# Patient Record
Sex: Female | Born: 1975 | Race: Black or African American | Hispanic: No | Marital: Single | Smoking: Current every day smoker
Health system: Southern US, Community
[De-identification: ages and names within clinical notes are randomized; demographics above are authoritative.]

---

## 2013-05-12 ENCOUNTER — Emergency Department: Payer: Self-pay | Admitting: Emergency Medicine

## 2018-07-08 ENCOUNTER — Encounter: Payer: Self-pay | Admitting: Emergency Medicine

## 2018-07-08 ENCOUNTER — Emergency Department: Payer: Self-pay

## 2018-07-08 ENCOUNTER — Other Ambulatory Visit: Payer: Self-pay

## 2018-07-08 ENCOUNTER — Emergency Department
Admission: EM | Admit: 2018-07-08 | Discharge: 2018-07-08 | Disposition: A | Discharge status: Home / Self Care | Payer: Self-pay | Attending: Emergency Medicine | Admitting: Emergency Medicine

## 2018-07-08 DIAGNOSIS — B9689 Other specified bacterial agents as the cause of diseases classified elsewhere: Secondary | ICD-10-CM | POA: Insufficient documentation

## 2018-07-08 DIAGNOSIS — F17210 Nicotine dependence, cigarettes, uncomplicated: Secondary | ICD-10-CM | POA: Insufficient documentation

## 2018-07-08 DIAGNOSIS — N760 Acute vaginitis: Secondary | ICD-10-CM | POA: Insufficient documentation

## 2018-07-08 DIAGNOSIS — N289 Disorder of kidney and ureter, unspecified: Secondary | ICD-10-CM | POA: Insufficient documentation

## 2018-07-08 LAB — COMPREHENSIVE METABOLIC PANEL
ALT: 13 U/L (ref 0–44)
AST: 15 U/L (ref 15–41)
Albumin: 3.8 g/dL (ref 3.5–5.0)
Alkaline Phosphatase: 48 U/L (ref 38–126)
Anion gap: 6 (ref 5–15)
BUN: 16 mg/dL (ref 6–20)
CO2: 23 mmol/L (ref 22–32)
Calcium: 8.7 mg/dL — ABNORMAL LOW (ref 8.9–10.3)
Chloride: 104 mmol/L (ref 98–111)
Creatinine, Ser: 0.76 mg/dL (ref 0.44–1.00)
GFR calc Af Amer: >60 mL/min (ref >60)
GFR calc non Af Amer: >60 mL/min (ref >60)
Glucose, Bld: 95 mg/dL (ref 70–99)
Potassium: 4 mmol/L (ref 3.5–5.1)
Sodium: 133 mmol/L — ABNORMAL LOW (ref 135–145)
Total Bilirubin: 0.5 mg/dL (ref 0.3–1.2)
Total Protein: 8 g/dL (ref 6.5–8.1)

## 2018-07-08 LAB — URINALYSIS, COMPLETE (UACMP) WITH MICROSCOPIC
Bacteria, UA: NONE SEEN
Bilirubin Urine: NEGATIVE
Glucose, UA: NEGATIVE mg/dL
Ketones, ur: NEGATIVE mg/dL
Leukocytes,Ua: NEGATIVE
Nitrite: NEGATIVE
Protein, ur: NEGATIVE mg/dL
Specific Gravity, Urine: 1.02 (ref 1.005–1.030)
pH: 5 (ref 5.0–8.0)

## 2018-07-08 LAB — WET PREP, GENITAL
Sperm: NONE SEEN
Trich, Wet Prep: NONE SEEN
Yeast Wet Prep HPF POC: NONE SEEN

## 2018-07-08 LAB — CBC
HCT: 39.9 % (ref 36.0–46.0)
Hemoglobin: 13 g/dL (ref 12.0–15.0)
MCH: 26.2 pg (ref 26.0–34.0)
MCHC: 32.6 g/dL (ref 30.0–36.0)
MCV: 80.4 fL (ref 80.0–100.0)
Platelets: 427 10*3/uL — ABNORMAL HIGH (ref 150–400)
RBC: 4.96 MIL/uL (ref 3.87–5.11)
RDW: 15.1 % (ref 11.5–15.5)
WBC: 12.2 10*3/uL — ABNORMAL HIGH (ref 4.0–10.5)
nRBC: 0 % (ref 0.0–0.2)

## 2018-07-08 LAB — POCT PREGNANCY, URINE: Preg Test, Ur: NEGATIVE

## 2018-07-08 LAB — LIPASE, BLOOD: Lipase: 30 U/L (ref 11–51)

## 2018-07-08 LAB — CHLAMYDIA/NGC RT PCR (ARMC ONLY)
Chlamydia Tr: NOT DETECTED
N gonorrhoeae: NOT DETECTED

## 2018-07-08 LAB — CHLAMYDIA/NGC RT PCR (ARMC ONLY)??????????

## 2018-07-08 MED ORDER — IOHEXOL 240 MG/ML SOLN
50.0000 mL | Freq: Once | INTRAMUSCULAR | Status: AC
  Administered 2018-07-08: 09:00:00 50 mL via ORAL

## 2018-07-08 MED ORDER — ONDANSETRON HCL 4 MG/2ML IJ SOLN
4.0000 mg | Freq: Once | INTRAMUSCULAR | Status: AC
  Administered 2018-07-08: 4 mg via INTRAVENOUS
  Filled 2018-07-08: qty 2

## 2018-07-08 MED ORDER — SODIUM CHLORIDE 0.9 % IV BOLUS
1000.0000 mL | Freq: Once | INTRAVENOUS | Status: AC
  Administered 2018-07-08: 1000 mL via INTRAVENOUS

## 2018-07-08 MED ORDER — METRONIDAZOLE 500 MG PO TABS: 500.0000 mg | ORAL_TABLET | Freq: Three times a day (TID) | ORAL | 0 refills | Status: DC

## 2018-07-08 MED ORDER — IOHEXOL 300 MG/ML  SOLN
100.0000 mL | Freq: Once | INTRAMUSCULAR | Status: AC | PRN
  Administered 2018-07-08: 100 mL via INTRAVENOUS

## 2018-07-08 MED ORDER — MORPHINE SULFATE (PF) 4 MG/ML IV SOLN
4.0000 mg | Freq: Once | INTRAVENOUS | Status: AC
  Administered 2018-07-08: 09:00:00 4 mg via INTRAVENOUS
  Filled 2018-07-08: qty 1

## 2018-07-08 MED ORDER — METRONIDAZOLE 500 MG PO TABS
500.0000 mg | ORAL_TABLET | Freq: Once | ORAL | Status: AC
  Administered 2018-07-08: 500 mg via ORAL
  Filled 2018-07-08: qty 1

## 2018-07-08 NOTE — ED Triage Notes (Signed)
Pt presents to ED via POV with c/o lower abdominal pain x 2 days with worsening last night. Denies N/V/D. Denies urinary symptoms but states feels like UTI in the past.

## 2018-07-08 NOTE — Discharge Instructions (Addendum)
OF NOTE: We see an abnormal area in the left kidney. Abdominal MRI with and without IV gadolinium is strongly recommended to evaluate further per our radiology doctor. Please see your primary doctor or UROLOGY for a follow-up on this concern and to obtain further testing to make cure this is not a cancer.  Please return to the emergency room right away if you are to develop a fever, severe nausea, your pain becomes severe or worsens, you are unable to keep food down, begin vomiting any dark or bloody fluid, you develop any dark or bloody stools, feel dehydrated, or other new concerns or symptoms arise.

## 2018-07-08 NOTE — ED Provider Notes (Signed)
[facility] Emergency Department Provider Note   ____________________________________________   First MD Initiated Contact with Patient 07/08/18 [phone]     (approximate)  I have reviewed the triage vital signs and the nursing notes.   HISTORY  Chief Complaint Abdominal Pain    HPI Adriana Harrison is a 43 y.o. female here for evaluation for lower and left lower abdominal pain  Patient reports about 2 days ago started having pain in her lower abdomen.  She reports it feels like is an area of her bladder but she has any pain or burning with urination which she is experience with urinary tract infection in the past  She denies pregnancy.  She is not noticed any unusual vaginal discharge except she had a spit of an irregular cycle last month which is unusual for her.  Pain does not radiate to the back.  Is located mostly in the area of her bladder and left lower abdomen.  No nausea vomiting or diarrhea.  No fevers or chills.  No travel history or sick contacts that she is aware of no chest pain or trouble breathing   History reviewed. No pertinent past medical history.  There are no active problems to display for this patient.   History reviewed. No pertinent surgical history.  Prior to Admission medications   Medication Sig Start Date End Date Taking? Authorizing Provider  metroNIDAZOLE (FLAGYL) 500 MG tablet Take 1 tablet (500 mg total) by mouth 3 (three) times daily. 07/08/18   Delman Kitten, MD    Allergies Patient has no known allergies.  No family history on file.  Social History Social History   Tobacco Use   Smoking status: Current Every Day Smoker   Smokeless tobacco: Never Used  Substance Use Topics   Alcohol use: Yes   Drug use: Yes    Types: Marijuana    Review of Systems Constitutional: No fever/chills Eyes: No visual changes. ENT: No sore throat. Cardiovascular: Denies chest pain. Respiratory: Denies shortness of  breath. Gastrointestinal: Moderate to severe pain in her mid to lower left abdomen. Genitourinary: Negative for dysuria. Musculoskeletal: Negative for back pain. Skin: Negative for rash. Neurological: Negative for headaches, areas of focal weakness or numbness.    ____________________________________________   PHYSICAL EXAM:  VITAL SIGNS: ED Triage Vitals [07/08/18 0736]  Enc Vitals Group     BP (!) 127/95     Pulse Rate 81     Resp 20     Temp 98 F (36.7 C)     Temp Source Oral     SpO2 98 %     Weight 180 lb (81.6 kg)     Height 5\' 4"  (1.626 m)     Head Circumference      Peak Flow      Pain Score 9     Pain Loc      Pain Edu?      Excl. in [place]?     Constitutional: Alert and oriented. Well appearing and in no acute distress. Eyes: Conjunctivae are normal. Head: Atraumatic. Nose: No congestion/rhinnorhea. Mouth/Throat: Mucous membranes are moist. Neck: No stridor.  Cardiovascular: Normal rate, regular rhythm. Grossly normal heart sounds.  Good peripheral circulation. Respiratory: Normal respiratory effort.  No retractions. Lungs CTAB. Gastrointestinal: Soft and nontender. No distention. Gynecologic: Performed with tech Kayla, external exam is normal.  Internal exam nontender.  No cervical motion tenderness.  Just a scant amount of slight whitish discharge is present, sent for wet prep and GC.  No frank adnexal tenderness.  Patient reports no tenderness to digital exam. Musculoskeletal: No lower extremity tenderness nor edema. Neurologic:  Normal speech and language. No gross focal neurologic deficits are appreciated.  Skin:  Skin is warm, dry and intact. No rash noted. Psychiatric: Mood and affect are normal. Speech and behavior are normal.  ____________________________________________   LABS (all labs ordered are listed, but only abnormal results are displayed)  Labs Reviewed  WET PREP, GENITAL - Abnormal; Notable for the following components:      Result  Value   Clue Cells Wet Prep HPF POC PRESENT (*)    WBC, Wet Prep HPF POC RARE (*)    All other components within normal limits  URINALYSIS, COMPLETE (UACMP) WITH MICROSCOPIC - Abnormal; Notable for the following components:   Color, Urine YELLOW (*)    APPearance HAZY (*)    Hgb urine dipstick SMALL (*)    All other components within normal limits  CBC - Abnormal; Notable for the following components:   WBC 12.2 (*)    Platelets 427 (*)    All other components within normal limits  COMPREHENSIVE METABOLIC PANEL - Abnormal; Notable for the following components:   Sodium 133 (*)    Calcium 8.7 (*)    All other components within normal limits  CHLAMYDIA/NGC RT PCR ([facility] ONLY)  LIPASE, BLOOD  POCT PREGNANCY, URINE   ____________________________________________  EKG   ____________________________________________  RADIOLOGY  US Transvaginal Non-ob  Result Date: 07/08/2018 CLINICAL DATA:  Left lower quadrant pain.  Suprapubic pain. EXAM: TRANSABDOMINAL AND TRANSVAGINAL ULTRASOUND OF PELVIS DOPPLER ULTRASOUND OF OVARIES TECHNIQUE: Both transabdominal and transvaginal ultrasound examinations of the pelvis were performed. Transabdominal technique was performed for global imaging of the pelvis including uterus, ovaries, adnexal regions, and pelvic cul-de-sac. It was necessary to proceed with endovaginal exam following the transabdominal exam to visualize the endometrium and ovaries. Color and duplex Doppler ultrasound was utilized to evaluate blood flow to the ovaries. COMPARISON:  None. FINDINGS: Uterus Measurements: 13.1 x 7.2 x 7.9 cm = volume: 394 mL. There is a 5.6 cm fibroid in the uterus. Endometrium Thickness: 5.1 mm.  No focal abnormality visualized. Right ovary The right ovary was not visualized. Left ovary Measurements: 2.8 x 2.5 x 2.6 cm = volume: 9.8 mL. Normal appearance/no adnexal mass. Pulsed Doppler evaluation of the left ovary demonstrates normal low-resistance  arterial and venous waveforms. Other findings No abnormal free fluid. IMPRESSION: 1. There is a 5.6 cm fibroid in the uterus. The uterus and endometrium are otherwise normal. 2. The left ovary is normal in appearance with arterial and venous blood flow identified. 3. The right ovary was not visualized. Electronically Signed   By: Dorise Bullion III M.D   On: 07/08/2018 10:37   US Pelvis Complete  Result Date: 07/08/2018 CLINICAL DATA:  Left lower quadrant pain.  Suprapubic pain. EXAM: TRANSABDOMINAL AND TRANSVAGINAL ULTRASOUND OF PELVIS DOPPLER ULTRASOUND OF OVARIES TECHNIQUE: Both transabdominal and transvaginal ultrasound examinations of the pelvis were performed. Transabdominal technique was performed for global imaging of the pelvis including uterus, ovaries, adnexal regions, and pelvic cul-de-sac. It was necessary to proceed with endovaginal exam following the transabdominal exam to visualize the endometrium and ovaries. Color and duplex Doppler ultrasound was utilized to evaluate blood flow to the ovaries. COMPARISON:  None. FINDINGS: Uterus Measurements: 13.1 x 7.2 x 7.9 cm = volume: 394 mL. There is a 5.6 cm fibroid in the uterus. Endometrium Thickness: 5.1 mm.  No focal abnormality visualized. Right  ovary The right ovary was not visualized. Left ovary Measurements: 2.8 x 2.5 x 2.6 cm = volume: 9.8 mL. Normal appearance/no adnexal mass. Pulsed Doppler evaluation of the left ovary demonstrates normal low-resistance arterial and venous waveforms. Other findings No abnormal free fluid. IMPRESSION: 1. There is a 5.6 cm fibroid in the uterus. The uterus and endometrium are otherwise normal. 2. The left ovary is normal in appearance with arterial and venous blood flow identified. 3. The right ovary was not visualized. Electronically Signed   By: Dorise Bullion III M.D   On: 07/08/2018 10:37   Ct Abdomen Pelvis W Contrast  Result Date: 07/08/2018 CLINICAL DATA:  43 year old female with history of  abdominal and pelvic pain for the past 4 days. EXAM: CT ABDOMEN AND PELVIS WITH CONTRAST TECHNIQUE: Multidetector CT imaging of the abdomen and pelvis was performed using the standard protocol following bolus administration of intravenous contrast. CONTRAST:  153mL OMNIPAQUE IOHEXOL 300 MG/ML  SOLN COMPARISON:  None. FINDINGS: Lower chest: Unremarkable. Hepatobiliary: Subcentimeter low-attenuation lesion in the central aspect of segment 8 of the liver, too small to characterize, but statistically likely a cyst. In segment 6 (axial image 25 of series 2) there is a centrally low-attenuation lesion measuring 2.1 x 2.5 cm with some peripheral nodular enhancement with progressive centripetal filling, diagnostic of a cavernous hemangioma. No other suspicious cystic or solid hepatic lesions. No intra or extrahepatic biliary ductal dilatation. Gallbladder is normal in appearance. Pancreas: No pancreatic mass. No pancreatic ductal dilatation. No pancreatic or peripancreatic fluid or inflammatory changes. Spleen: Unremarkable. Adrenals/Urinary Tract: In the lateral aspect of the interpolar region of the left kidney there is a 3.1 x 2.9 x 3.3 cm lesion which demonstrates heterogeneous internal enhancement/attenuation, and has some peripheral calcifications, considered indeterminate. This is separate from the left renal vein which is widely patent, and appears encapsulated within Gerota's fascia. Right kidney and bilateral adrenal glands are normal in appearance. No hydroureteronephrosis. Urinary bladder is normal in appearance. Stomach/Bowel: Normal appearance of the stomach. No pathologic dilatation of small bowel or colon. Normal appendix. Vascular/Lymphatic: No atherosclerotic calcifications in the abdominal aorta or pelvic vasculature. No lymphadenopathy noted in the abdomen or pelvis. Reproductive: In the fundus of the uterus there is a 4.8 x 5.8 x 5.3 cm centrally hypovascular lesion, likely to represent a large fibroid.  Several other smaller uterine lesions are also noted. Ovaries are unremarkable in appearance. Other: No significant volume of ascites.  No pneumoperitoneum. Musculoskeletal: There are no aggressive appearing lytic or blastic lesions noted in the visualized portions of the skeleton. IMPRESSION: 1. No acute findings are noted in the abdomen or pelvis to account for the patient's symptoms. 2. Indeterminate lesion in the interpolar region of the left kidney. Further evaluation with nonemergent abdominal MRI with and without IV gadolinium is strongly recommended to evaluate for potential neoplasm. 3. Fibroid uterus. 4. 2.1 x 2.5 cm cavernous hemangioma in segment 6 of the liver incidentally noted. Electronically Signed   By: Vinnie Langton M.D.   On: 07/08/2018 11:16   [place] Art/ven Flow Abd Pelv Doppler  Result Date: 07/08/2018 CLINICAL DATA:  Left lower quadrant pain.  Suprapubic pain. EXAM: TRANSABDOMINAL AND TRANSVAGINAL ULTRASOUND OF PELVIS DOPPLER ULTRASOUND OF OVARIES TECHNIQUE: Both transabdominal and transvaginal ultrasound examinations of the pelvis were performed. Transabdominal technique was performed for global imaging of the pelvis including uterus, ovaries, adnexal regions, and pelvic cul-de-sac. It was necessary to proceed with endovaginal exam following the transabdominal exam to visualize the endometrium and  ovaries. Color and duplex Doppler ultrasound was utilized to evaluate blood flow to the ovaries. COMPARISON:  None. FINDINGS: Uterus Measurements: 13.1 x 7.2 x 7.9 cm = volume: 394 mL. There is a 5.6 cm fibroid in the uterus. Endometrium Thickness: 5.1 mm.  No focal abnormality visualized. Right ovary The right ovary was not visualized. Left ovary Measurements: 2.8 x 2.5 x 2.6 cm = volume: 9.8 mL. Normal appearance/no adnexal mass. Pulsed Doppler evaluation of the left ovary demonstrates normal low-resistance arterial and venous waveforms. Other findings No abnormal free fluid. IMPRESSION: 1.  There is a 5.6 cm fibroid in the uterus. The uterus and endometrium are otherwise normal. 2. The left ovary is normal in appearance with arterial and venous blood flow identified. 3. The right ovary was not visualized. Electronically Signed   By: Dorise Bullion III M.D   On: 07/08/2018 10:37   Ultrasound reviewed, negative for acute ovarian process.  Pain involves the left side, the right ovary is not seen but this is also not the area of pain.  I discussed the CT finding especially of the concerned about her left kidney and need for follow-up, she will follow-up with urology to obtain further evaluation and MRI for this. ____________________________________________   PROCEDURES  Procedure(s) performed: None  Procedures  Critical Care performed: No  ____________________________________________   INITIAL IMPRESSION / ASSESSMENT AND PLAN / ED COURSE  Pertinent labs & imaging results that were available during my care of the patient were reviewed by me and considered in my medical decision making (see chart for details).   Differential diagnosis includes but is not limited to, abdominal perforation, aortic dissection, cholecystitis, appendicitis, diverticulitis, colitis, esophagitis/gastritis, kidney stone, pyelonephritis, urinary tract infection, aortic aneurysm. All are considered in decision and treatment plan. Based upon the patient's presentation and risk factors, we obtained a pelvic exam that did not show adnexal tenderness, her pregnancy test is negative.  Her urinalysis does not show any sign of infection thus prompting me to look further for cause including rule out causes such as ovarian cyst, cyst rupture, torsion though I find this unlikely with 2 days and the type of her pain she is experiencing, also diverticulitis, retrocecal appendicitis etc. are all considered.  And we will proceed with CT scan as well as ultrasound at this point  Adriana Harrison was evaluated in Emergency  Department on 07/08/2018 for the symptoms described in the history of present illness. She was evaluated in the context of the global COVID-19 pandemic, which necessitated consideration that the patient might be at risk for infection with the SARS-CoV-2 virus that causes COVID-19. Institutional protocols and algorithms that pertain to the evaluation of patients at risk for COVID-19 are in a state of rapid change based on information released by regulatory bodies including the CDC and federal and state organizations. These policies and algorithms were followed during the patient's care in the ED. patient does not have symptomatology that would suggest the need for [facility] testing at this time    ----------------------------------------- 8:52 AM on 07/08/2018 -----------------------------------------  Patient reports the pain is now improving she is resting at this time.  Weight and patient agreeable with plan for CT and ultrasound evaluation  ----------------------------------------- 11:38 AM on 07/08/2018 -----------------------------------------  Patient is ambulatory in no distress.  CT and ultrasound findings reviewed, no acute concerns to explain her discomfort and at this point I suspect likely bacterial vaginosis given the low nature of her pain and discomfort.  She is stable, appears appropriate  for careful return precautions and will treat with oral Flagyl.  Patient agreeable and strongly understanding of the need for urgent urology follow-up to further evaluate lesion on the left kidney.  She will call and set up an appointment tomorrow  Return precautions and treatment recommendations and follow-up discussed with the patient who is agreeable with the plan.  ____________________________________________   FINAL CLINICAL IMPRESSION(S) / ED DIAGNOSES  Final diagnoses:  Renal lesion  Bacterial vaginosis        Note:  This document was prepared using Dragon voice recognition software  and may include unintentional dictation errors       Delman Kitten, MD 07/08/18 1139

## 2018-07-09 ENCOUNTER — Telehealth: Payer: Self-pay | Admitting: Urology

## 2018-07-09 NOTE — Telephone Encounter (Signed)
Pt states she was seen in the ER yesterday and they found a mass on her kidney. She is calling for a follow up appt. Please advise.

## 2018-07-10 NOTE — Telephone Encounter (Signed)
Please advise regarding follow up appointment.

## 2018-07-10 NOTE — Telephone Encounter (Signed)
She should be seen within the next 4-6 weeks.  Based on the mass size and age this may need to be treated surgically so would schedule with either Erlene Quan or [place].  She is going to need additional imaging so the alternative would be to schedule a virtual visit.

## 2018-07-10 NOTE — Telephone Encounter (Signed)
Appointment made

## 2018-07-16 ENCOUNTER — Other Ambulatory Visit: Payer: Self-pay

## 2018-07-16 ENCOUNTER — Telehealth: Payer: Self-pay | Admitting: Urology

## 2019-02-14 ENCOUNTER — Other Ambulatory Visit: Payer: Self-pay

## 2019-02-14 ENCOUNTER — Emergency Department
Admission: EM | Admit: 2019-02-14 | Discharge: 2019-02-14 | Disposition: A | Discharge status: Home / Self Care | Payer: Self-pay | Attending: Emergency Medicine | Admitting: Emergency Medicine

## 2019-02-14 ENCOUNTER — Encounter: Payer: Self-pay | Admitting: Emergency Medicine

## 2019-02-14 ENCOUNTER — Emergency Department: Payer: Self-pay

## 2019-02-14 DIAGNOSIS — M25532 Pain in left wrist: Secondary | ICD-10-CM | POA: Insufficient documentation

## 2019-02-14 DIAGNOSIS — F17210 Nicotine dependence, cigarettes, uncomplicated: Secondary | ICD-10-CM | POA: Insufficient documentation

## 2019-02-14 DIAGNOSIS — M79642 Pain in left hand: Secondary | ICD-10-CM | POA: Insufficient documentation

## 2019-02-14 LAB — POCT PREGNANCY, URINE: Preg Test, Ur: NEGATIVE

## 2019-02-14 MED ORDER — TRAMADOL HCL 50 MG PO TABS
50.0000 mg | ORAL_TABLET | Freq: Once | ORAL | Status: AC
  Administered 2019-02-14: 09:00:00 50 mg via ORAL
  Filled 2019-02-14: qty 1

## 2019-02-14 MED ORDER — TRAMADOL HCL 50 MG PO TABS: 50.0000 mg | ORAL_TABLET | Freq: Four times a day (QID) | ORAL | 0 refills | Status: DC | PRN

## 2019-02-14 MED ORDER — NAPROXEN 500 MG PO TABS: 500.0000 mg | ORAL_TABLET | Freq: Two times a day (BID) | ORAL | 0 refills | Status: DC

## 2019-02-14 NOTE — ED Triage Notes (Signed)
Patient ambulatory to triage with steady gait, without difficulty or distress noted, mask in place; pt reports awoke 2 days ago with pain to left inner wrist & palm; denies any known injury; st pain increases when trying to make a fist; denies hx of same

## 2019-02-14 NOTE — Discharge Instructions (Signed)
Follow-up with your primary care provider or can no clinic acute care if any continued problems.  Wear the Ace wrap for support and protection.  Begin taking naproxen 500 mg twice daily with food along with tramadol if needed for moderate to severe pain.  Do not drive or operate machinery while taking the tramadol.  You may use ice to your hand as needed for discomfort.

## 2019-02-14 NOTE — ED Notes (Signed)
See triage note  Presents with pain to left hand and wrist area  States she woke up with pain  Denies any trauma  Good pulses

## 2019-02-14 NOTE — ED Provider Notes (Signed)
[facility] Emergency Department Provider Note  ____________________________________________   First MD Initiated Contact with Patient 02/14/19 [phone]     (approximate)  I have reviewed the triage vital signs and the nursing notes.   HISTORY  Chief Complaint Hand Pain   HPI Adriana Harrison is a 43 y.o. female presents to the ED with complaint of left hand and wrist pain.  Patient states that she woke this morning with pain.  She took Tylenol without any relief.  She denies any injury.  She states she started a new job 2 weeks ago and is right-hand dominant.  Denies any previous injury to her hand.  She currently rates her pain as a 10/10.  She also needs a note for work as she did not go today.     History reviewed. No pertinent past medical history.  There are no active problems to display for this patient.   History reviewed. No pertinent surgical history.  Prior to Admission medications   Medication Sig Start Date End Date Taking? Authorizing Provider  naproxen (NAPROSYN) 500 MG tablet Take 1 tablet (500 mg total) by mouth 2 (two) times daily with a meal. 02/14/19   Johnn Hai, PA-C  traMADol (ULTRAM) 50 MG tablet Take 1 tablet (50 mg total) by mouth every 6 (six) hours as needed. 02/14/19   Johnn Hai, PA-C    Allergies Patient has no known allergies.  No family history on file.  Social History Social History   Tobacco Use  . Smoking status: Current Every Day Smoker  . Smokeless tobacco: Never Used  Substance Use Topics  . Alcohol use: Yes  . Drug use: Yes    Types: Marijuana    Review of Systems Constitutional: No fever/chills Cardiovascular: Denies chest pain. Respiratory: Denies shortness of breath. Genitourinary: Negative for dysuria. Musculoskeletal: Positive left hand and wrist pain. Skin: Negative for rash. Neurological: Negative for headaches, focal weakness or numbness.  ____________________________________________   PHYSICAL EXAM:  VITAL SIGNS: ED Triage Vitals  Enc Vitals Group     BP 02/14/19 0448 139/87     Pulse Rate 02/14/19 0448 92     Resp 02/14/19 0448 17     Temp 02/14/19 0448 99.1 F (37.3 C)     Temp Source 02/14/19 0448 Oral     SpO2 02/14/19 0448 97 %     Weight 02/14/19 0447 208 lb (94.3 kg)     Height 02/14/19 0447 5' (1.524 m)     Head Circumference --      Peak Flow --      Pain Score 02/14/19 0447 10     Pain Loc --      Pain Edu? --      Excl. in [place]? --    Constitutional: Alert and oriented. Well appearing and in no acute distress. Eyes: Conjunctivae are normal.  Head: Atraumatic. Neck: No stridor.   Cardiovascular: Normal rate, regular rhythm. Grossly normal heart sounds.  Good peripheral circulation. Respiratory: Normal respiratory effort.  No retractions. Lungs CTAB. Musculoskeletal: On examination of the left wrist and hand there is no gross deformity and no soft tissue edema present.  Patient is able to move digits distally and is able to grip without any difficulty.  Skin is intact.  No discoloration noted.  Motor sensory function intact and capillary refill is less than 3 seconds.  There is minimal tenderness on flexion and extension of her wrist.  Negative Tennille sign present. Neurologic:  Normal  speech and language. No gross focal neurologic deficits are appreciated. No gait instability. Skin:  Skin is warm, dry and intact. No rash noted. Psychiatric: Mood and affect are normal. Speech and behavior are normal.  ____________________________________________   LABS (all labs ordered are listed, but only abnormal results are displayed)  Labs Reviewed  POC URINE PREG, ED  POCT PREGNANCY, URINE   _RADIOLOGY   Official radiology report(s): Dg Hand Complete Left  Result Date: 02/14/2019 CLINICAL DATA:  Pain in left hand worse on palmar surface after waking up. EXAM: LEFT HAND - COMPLETE 3+ VIEW COMPARISON:   None. FINDINGS: There is no evidence of fracture or dislocation. There is no evidence of arthropathy or other focal bone abnormality. Soft tissues may show some swelling along the volar surface, this is not clear. IMPRESSION: Possible soft tissue swelling over the volar aspect of the left hand, correlate with physical examination. No underlying bony abnormality or radiopaque foreign body. Electronically Signed   By: Zetta Bills M.D.   On: 02/14/2019 07:57    ____________________________________________   PROCEDURES  Procedure(s) performed (including Critical Care):  Procedures Velcro cock-up wrist splint was applied by nurse.  ____________________________________________   INITIAL IMPRESSION / ASSESSMENT AND PLAN / ED COURSE  As part of my medical decision making, I reviewed the following data within the electronic MEDICAL RECORD NUMBER Notes from prior ED visits and [place] Controlled Substance Database  43 year old female presents to the ED with complaint of left hand and wrist pain for the last 2 days.  Patient denies any known injury but states that she started a new job.  Patient has taken Tylenol without any relief.  Physical exam was unremarkable and x-ray was negative.  Patient was placed in a Velcro cock-up splint and given a note for work.  She also was given a prescription for naproxen 500 mg twice daily with food every day.  She was given tramadol to take today every 6 hours as needed for severe pain. ____________________________________________   FINAL CLINICAL IMPRESSION(S) / ED DIAGNOSES  Final diagnoses:  Left hand pain     ED Discharge Orders         Ordered    naproxen (NAPROSYN) 500 MG tablet  2 times daily with meals     02/14/19 0840    traMADol (ULTRAM) 50 MG tablet  Every 6 hours PRN     02/14/19 0840           Note:  This document was prepared using Dragon voice recognition software and may include unintentional dictation errors.    Johnn Hai,  PA-C 02/14/19 1516    Blake Divine, MD 02/15/19 [phone]

## 2019-10-23 ENCOUNTER — Emergency Department
Admission: EM | Admit: 2019-10-23 | Discharge: 2019-10-23 | Disposition: A | Discharge status: Home / Self Care | Payer: Self-pay | Attending: Emergency Medicine | Admitting: Emergency Medicine

## 2019-10-23 ENCOUNTER — Other Ambulatory Visit: Payer: Self-pay

## 2019-10-23 ENCOUNTER — Encounter: Payer: Self-pay | Admitting: Emergency Medicine

## 2019-10-23 DIAGNOSIS — F17210 Nicotine dependence, cigarettes, uncomplicated: Secondary | ICD-10-CM | POA: Insufficient documentation

## 2019-10-23 DIAGNOSIS — M779 Enthesopathy, unspecified: Secondary | ICD-10-CM | POA: Insufficient documentation

## 2019-10-23 DIAGNOSIS — M778 Other enthesopathies, not elsewhere classified: Secondary | ICD-10-CM

## 2019-10-23 MED ORDER — NAPROXEN 500 MG PO TABS: 500.0000 mg | ORAL_TABLET | Freq: Two times a day (BID) | ORAL | 0 refills | Status: DC

## 2019-10-23 MED ORDER — NAPROXEN 500 MG PO TABS
500.0000 mg | ORAL_TABLET | Freq: Once | ORAL | Status: AC
  Administered 2019-10-23: 500 mg via ORAL
  Filled 2019-10-23: qty 1

## 2019-10-23 MED ORDER — TRAMADOL HCL 50 MG PO TABS: 50.0000 mg | ORAL_TABLET | Freq: Four times a day (QID) | ORAL | 0 refills | Status: AC | PRN

## 2019-10-23 NOTE — ED Triage Notes (Signed)
Pain to anterior left wrist. Denies injury, pain worse with movement. Started couple weeks ago when woke up. No swelling, redness or deformity. Radial pulse WNL

## 2019-10-23 NOTE — ED Provider Notes (Signed)
[facility] Emergency Department Provider Note   ____________________________________________   First MD Initiated Contact with Patient 10/23/19 [phone]     (approximate)  I have reviewed the triage vital signs and the nursing notes.   HISTORY  Chief Complaint Wrist Pain    HPI Adriana Harrison is a 44 y.o. female patient complain of nontraumatic left wrist pain for 8 months.  Patient was seen for same complaint at this facility 8 months ago.  Patient state complaint resolved using anti-inflammatory medications and wrist splint.  Patient states pain returned a couple weeks ago.  Patient is right-hand dominant.  Patient rates the pain as 10/10.  Patient described the pain as "achy.  Patient states pain increases with extension of the fingers.  No palliative measure for complaint.       History reviewed. No pertinent past medical history.  There are no problems to display for this patient.   History reviewed. No pertinent surgical history.  Prior to Admission medications   Medication Sig Start Date End Date Taking? Authorizing Provider  naproxen (NAPROSYN) 500 MG tablet Take 1 tablet (500 mg total) by mouth 2 (two) times daily with a meal. 10/23/19   Sable Feil, PA-C  traMADol (ULTRAM) 50 MG tablet Take 1 tablet (50 mg total) by mouth every 6 (six) hours as needed. 10/23/19 10/22/20  Sable Feil, PA-C    Allergies Patient has no known allergies.  History reviewed. No pertinent family history.  Social History Social History   Tobacco Use  . Smoking status: Current Every Day Smoker  . Smokeless tobacco: Never Used  Substance Use Topics  . Alcohol use: Yes  . Drug use: Yes    Types: Marijuana    Review of Systems Constitutional: No fever/chills Eyes: No visual changes. ENT: No sore throat. Cardiovascular: Denies chest pain. Respiratory: Denies shortness of breath. Gastrointestinal: No abdominal pain.  No nausea, no vomiting.  No  diarrhea.  No constipation. Genitourinary: Negative for dysuria. Musculoskeletal: Left hand/wrist pain. Skin: Negative for rash. Neurological: Negative for headaches, focal weakness or numbness.   ____________________________________________   PHYSICAL EXAM:  VITAL SIGNS: ED Triage Vitals  Enc Vitals Group     BP 10/23/19 0748 (!) 121/93     Pulse Rate 10/23/19 0748 90     Resp 10/23/19 0748 18     Temp 10/23/19 0748 98.5 F (36.9 C)     Temp Source 10/23/19 0748 Oral     SpO2 10/23/19 0748 98 %     Weight 10/23/19 0732 212 lb (96.2 kg)     Height 10/23/19 0732 5\' 4"  (1.626 m)     Head Circumference --      Peak Flow --      Pain Score 10/23/19 0732 10     Pain Loc --      Pain Edu? --      Excl. in [place]? --    Constitutional: Alert and oriented. Well appearing and in no acute distress. Cardiovascular: Normal rate, regular rhythm. Grossly normal heart sounds.  Good peripheral circulation. Respiratory: Normal respiratory effort.  No retractions. Lungs CTAB. Musculoskeletal: No obvious deformity to the left wrist or hand. Neurologic:  Normal speech and language. No gross focal neurologic deficits are appreciated. No gait instability. Skin:  Skin is warm, dry and intact. No rash noted. Psychiatric: Mood and affect are normal. Speech and behavior are normal.  ____________________________________________   LABS (all labs ordered are listed, but only abnormal results are  displayed)  Labs Reviewed - No data to display ____________________________________________  EKG   ____________________________________________  RADIOLOGY  ED MD interpretation:    Official radiology report(s): No results found.  ____________________________________________   PROCEDURES  Procedure(s) performed (including Critical Care):  Procedures   ____________________________________________   INITIAL IMPRESSION / ASSESSMENT AND PLAN / ED COURSE  As part of my medical decision  making, I reviewed the following data within the [place]      Patient presents with 2 weeks of left wrist/hand pain will follow up for walking incident.  Patient complaint physical exam is consistent with tendinitis.  Patient placed in a wrist splint and given discharge care instructions.  Patient advised [facility].    Adriana Harrison was evaluated in Emergency Department on 10/23/2019 for the symptoms described in the history of present illness. She was evaluated in the context of the global COVID-19 pandemic, which necessitated consideration that the patient might be at risk for infection with the SARS-CoV-2 virus that causes COVID-19. Institutional protocols and algorithms that pertain to the evaluation of patients at risk for COVID-19 are in a state of rapid change based on information released by regulatory bodies including the CDC and federal and state organizations. These policies and algorithms were followed during the patient's care in the ED.       ____________________________________________   FINAL CLINICAL IMPRESSION(S) / ED DIAGNOSES  Final diagnoses:  Tendinitis of left wrist     ED Discharge Orders         Ordered    naproxen (NAPROSYN) 500 MG tablet  2 times daily with meals     Discontinue  Reprint     10/23/19 0849    traMADol (ULTRAM) 50 MG tablet  Every 6 hours PRN     Discontinue  Reprint     10/23/19 0849           Note:  This document was prepared using Dragon voice recognition software and may include unintentional dictation errors.    Sable Feil, PA-C 10/23/19 8372    Delman Kitten, MD 10/23/19 [phone]

## 2019-10-23 NOTE — ED Notes (Signed)
See triage note  Presents with pain to left wrist area  States pain started couple of weeks ago w/o injury  Good pulses   No swelling

## 2019-10-23 NOTE — Discharge Instructions (Addendum)
Follow discharge care instruction and wear splint for 7 to 10 days while awake.

## 2020-03-29 ENCOUNTER — Other Ambulatory Visit: Payer: Self-pay

## 2020-03-29 ENCOUNTER — Emergency Department
Admission: EM | Admit: 2020-03-29 | Discharge: 2020-03-29 | Disposition: A | Discharge status: Home / Self Care | Payer: Self-pay | Attending: Emergency Medicine | Admitting: Emergency Medicine

## 2020-03-29 ENCOUNTER — Encounter: Payer: Self-pay | Admitting: Emergency Medicine

## 2020-03-29 ENCOUNTER — Emergency Department: Payer: Self-pay

## 2020-03-29 DIAGNOSIS — D259 Leiomyoma of uterus, unspecified: Secondary | ICD-10-CM | POA: Insufficient documentation

## 2020-03-29 DIAGNOSIS — M25551 Pain in right hip: Secondary | ICD-10-CM | POA: Insufficient documentation

## 2020-03-29 DIAGNOSIS — F17200 Nicotine dependence, unspecified, uncomplicated: Secondary | ICD-10-CM | POA: Insufficient documentation

## 2020-03-29 DIAGNOSIS — R102 Pelvic and perineal pain: Secondary | ICD-10-CM

## 2020-03-29 LAB — CBC WITH DIFFERENTIAL/PLATELET
Abs Immature Granulocytes: 0.1 10*3/uL — ABNORMAL HIGH (ref 0.00–0.07)
Basophils Absolute: 0.1 10*3/uL (ref 0.0–0.1)
Basophils Relative: 0 %
Eosinophils Absolute: 0 10*3/uL (ref 0.0–0.5)
Eosinophils Relative: 0 %
HCT: 47.5 % — ABNORMAL HIGH (ref 36.0–46.0)
Hemoglobin: 15.5 g/dL — ABNORMAL HIGH (ref 12.0–15.0)
Immature Granulocytes: 1 %
Lymphocytes Relative: 10 %
Lymphs Abs: 1.9 10*3/uL (ref 0.7–4.0)
MCH: 26.9 pg (ref 26.0–34.0)
MCHC: 32.6 g/dL (ref 30.0–36.0)
MCV: 82.3 fL (ref 80.0–100.0)
Monocytes Absolute: 1.1 10*3/uL — ABNORMAL HIGH (ref 0.1–1.0)
Monocytes Relative: 6 %
Neutro Abs: 14.9 10*3/uL — ABNORMAL HIGH (ref 1.7–7.7)
Neutrophils Relative %: 83 %
Platelets: 374 10*3/uL (ref 150–400)
RBC: 5.77 MIL/uL — ABNORMAL HIGH (ref 3.87–5.11)
RDW: 15.2 % (ref 11.5–15.5)
WBC: 18.1 10*3/uL — ABNORMAL HIGH (ref 4.0–10.5)
nRBC: 0 % (ref 0.0–0.2)

## 2020-03-29 LAB — COMPREHENSIVE METABOLIC PANEL
ALT: 18 U/L (ref 0–44)
AST: 22 U/L (ref 15–41)
Albumin: 3.9 g/dL (ref 3.5–5.0)
Alkaline Phosphatase: 46 U/L (ref 38–126)
Anion gap: 10 (ref 5–15)
BUN: 18 mg/dL (ref 6–20)
CO2: 21 mmol/L — ABNORMAL LOW (ref 22–32)
Calcium: 9.1 mg/dL (ref 8.9–10.3)
Chloride: 104 mmol/L (ref 98–111)
Creatinine, Ser: 0.71 mg/dL (ref 0.44–1.00)
GFR, Estimated: >60 mL/min (ref >60)
Glucose, Bld: 125 mg/dL — ABNORMAL HIGH (ref 70–99)
Potassium: 4.2 mmol/L (ref 3.5–5.1)
Sodium: 135 mmol/L (ref 135–145)
Total Bilirubin: 0.6 mg/dL (ref 0.3–1.2)
Total Protein: 7.4 g/dL (ref 6.5–8.1)

## 2020-03-29 LAB — URINALYSIS, COMPLETE (UACMP) WITH MICROSCOPIC
Bilirubin Urine: NEGATIVE
Glucose, UA: NEGATIVE mg/dL
Hgb urine dipstick: NEGATIVE
Ketones, ur: NEGATIVE mg/dL
Leukocytes,Ua: NEGATIVE
Nitrite: NEGATIVE
Protein, ur: NEGATIVE mg/dL
Specific Gravity, Urine: 1.024 (ref 1.005–1.030)
pH: 5 (ref 5.0–8.0)

## 2020-03-29 LAB — POC URINE PREG, ED: Preg Test, Ur: NEGATIVE

## 2020-03-29 LAB — WET PREP, GENITAL
Sperm: NONE SEEN
Yeast Wet Prep HPF POC: NONE SEEN

## 2020-03-29 LAB — CHLAMYDIA/NGC RT PCR (ARMC ONLY)
Chlamydia Tr: NOT DETECTED
N gonorrhoeae: NOT DETECTED

## 2020-03-29 LAB — LIPASE, BLOOD: Lipase: 26 U/L (ref 11–51)

## 2020-03-29 MED ORDER — IOHEXOL 300 MG/ML  SOLN
100.0000 mL | Freq: Once | INTRAMUSCULAR | Status: AC | PRN
  Administered 2020-03-29: 100 mL via INTRAVENOUS
  Filled 2020-03-29: qty 100

## 2020-03-29 MED ORDER — ONDANSETRON HCL 4 MG/2ML IJ SOLN
4.0000 mg | Freq: Once | INTRAMUSCULAR | Status: AC
  Administered 2020-03-29: 4 mg via INTRAVENOUS
  Filled 2020-03-29: qty 2

## 2020-03-29 MED ORDER — KETOROLAC TROMETHAMINE 30 MG/ML IJ SOLN
15.0000 mg | Freq: Once | INTRAMUSCULAR | Status: AC
  Administered 2020-03-29: 15 mg via INTRAVENOUS
  Filled 2020-03-29: qty 1

## 2020-03-29 MED ORDER — CYCLOBENZAPRINE HCL 5 MG PO TABS: 5.0000 mg | ORAL_TABLET | Freq: Three times a day (TID) | ORAL | 0 refills | Status: DC | PRN

## 2020-03-29 MED ORDER — FENTANYL CITRATE (PF) 100 MCG/2ML IJ SOLN
50.0000 ug | INTRAMUSCULAR | Status: AC | PRN
  Administered 2020-03-29 (×2): 50 ug via INTRAVENOUS
  Filled 2020-03-29 (×2): qty 2

## 2020-03-29 MED ORDER — MORPHINE SULFATE (PF) 4 MG/ML IV SOLN: 4.0000 mg | Freq: Once | INTRAVENOUS | Status: DC

## 2020-03-29 MED ORDER — OXYCODONE-ACETAMINOPHEN 5-325 MG PO TABS
1.0000 | ORAL_TABLET | Freq: Once | ORAL | Status: AC
  Administered 2020-03-29: 1 via ORAL
  Filled 2020-03-29: qty 1

## 2020-03-29 MED ORDER — METRONIDAZOLE 500 MG PO TABS
2000.0000 mg | ORAL_TABLET | Freq: Once | ORAL | Status: AC
  Administered 2020-03-29: 2000 mg via ORAL
  Filled 2020-03-29: qty 4

## 2020-03-29 MED ORDER — LIDOCAINE 5 % EX PTCH: 1.0000 | MEDICATED_PATCH | Freq: Two times a day (BID) | CUTANEOUS | 0 refills | Status: AC

## 2020-03-29 NOTE — ED Provider Notes (Signed)
[facility] Emergency Department Provider Note   ____________________________________________   None    (approximate)  I have reviewed the triage vital signs and the nursing notes.   HISTORY  Chief Complaint Hip Pain    HPI Adriana Harrison is a 45 y.o. female with no significant past medical history who presents to the ED complaining of hip and abdominal pain. Patient reports sudden onset of pain in her right groin and hip starting earlier this morning. Pain started off like a dull ache but has gradually worsened to sharp and severe pain. It is exacerbated by any movement, not alleviated by anything in particular. She denies any associated nausea, vomiting, changes in bowel movements, dysuria, or hematuria. She is currently on her period, denies any vaginal discharge. She denies any history of similar pain in the past, denies any abdominal surgical history. She has not had any recent trauma to her right hip.        History reviewed. No pertinent past medical history.  There are no problems to display for this patient.   History reviewed. No pertinent surgical history.  Prior to Admission medications   Medication Sig Start Date End Date Taking? Authorizing Provider  cyclobenzaprine (FLEXERIL) 5 MG tablet Take 1 tablet (5 mg total) by mouth 3 (three) times daily as needed for muscle spasms. 03/29/20  Yes Blake Divine, MD  lidocaine (LIDODERM) 5 % Place 1 patch onto the skin every 12 (twelve) hours. Remove & Discard patch within 12 hours or as directed by MD 03/29/20 03/29/21 Yes Blake Divine, MD  naproxen (NAPROSYN) 500 MG tablet Take 1 tablet (500 mg total) by mouth 2 (two) times daily with a meal. 10/23/19   Sable Feil, PA-C  traMADol (ULTRAM) 50 MG tablet Take 1 tablet (50 mg total) by mouth every 6 (six) hours as needed. 10/23/19 10/22/20  Sable Feil, PA-C    Allergies Patient has no known allergies.  History reviewed. No pertinent  family history.  Social History Social History   Tobacco Use  . Smoking status: Current Every Day Smoker  . Smokeless tobacco: Never Used  Substance Use Topics  . Alcohol use: Yes  . Drug use: Yes    Types: Marijuana    Review of Systems  Constitutional: No fever/chills Eyes: No visual changes. ENT: No sore throat. Cardiovascular: Denies chest pain. Respiratory: Denies shortness of breath. Gastrointestinal: Positive for abdominal pain.  No nausea, no vomiting.  No diarrhea.  No constipation. Genitourinary: Negative for dysuria. Positive for vaginal bleeding. Musculoskeletal: Negative for back pain. Skin: Negative for rash. Neurological: Negative for headaches, focal weakness or numbness.  ____________________________________________   PHYSICAL EXAM:  VITAL SIGNS: ED Triage Vitals [03/29/20 0514]  Enc Vitals Group     BP (!) 121/98     Pulse Rate 82     Resp (!) 22     Temp 98.4 F (36.9 C)     Temp src      SpO2 95 %     Weight      Height      Head Circumference      Peak Flow      Pain Score 10     Pain Loc      Pain Edu?      Excl. in [place]?     Constitutional: Alert and oriented. Eyes: Conjunctivae are normal. Head: Atraumatic. Nose: No congestion/rhinnorhea. Mouth/Throat: Mucous membranes are moist. Neck: Normal ROM Cardiovascular: Normal rate, regular rhythm. Grossly normal  heart sounds. 2+ DP pulses bilaterally. Respiratory: Normal respiratory effort.  No retractions. Lungs CTAB. Gastrointestinal: Soft and tender to palpation in the bilateral lower quadrants with no rebound or guarding. No distention. Genitourinary: Scant vaginal bleeding, no discharge noted, no cervical motion adnexal tenderness. Musculoskeletal: No lower extremity tenderness nor edema. Pain with movement of right hip. Neurologic:  Normal speech and language. No gross focal neurologic deficits are appreciated. Skin:  Skin is warm, dry and intact. No rash noted. Psychiatric: Mood  and affect are normal. Speech and behavior are normal.  ____________________________________________   LABS (all labs ordered are listed, but only abnormal results are displayed)  Labs Reviewed  WET PREP, GENITAL - Abnormal; Notable for the following components:      Result Value   Trich, Wet Prep PRESENT (*)    Clue Cells Wet Prep HPF POC PRESENT (*)    WBC, Wet Prep HPF POC FEW (*)    All other components within normal limits  CBC WITH DIFFERENTIAL/PLATELET - Abnormal; Notable for the following components:   WBC 18.1 (*)    RBC 5.77 (*)    Hemoglobin 15.5 (*)    HCT 47.5 (*)    Neutro Abs 14.9 (*)    Monocytes Absolute 1.1 (*)    Abs Immature Granulocytes 0.10 (*)    All other components within normal limits  COMPREHENSIVE METABOLIC PANEL - Abnormal; Notable for the following components:   CO2 21 (*)    Glucose, Bld 125 (*)    All other components within normal limits  URINALYSIS, COMPLETE (UACMP) WITH MICROSCOPIC - Abnormal; Notable for the following components:   Color, Urine YELLOW (*)    APPearance HAZY (*)    Bacteria, UA RARE (*)    All other components within normal limits  CHLAMYDIA/NGC RT PCR ([facility] ONLY)  LIPASE, BLOOD  POC URINE PREG, ED    PROCEDURES  Procedure(s) performed (including Critical Care):  Procedures   ____________________________________________   INITIAL IMPRESSION / ASSESSMENT AND PLAN / ED COURSE       45 year old female with no significant past medical history presents to the ED complaining of cute onset right hip and groin pain worse with movement of her right leg. She is neurovascular intact to her right lower extremity and denies any recent trauma to her right hip. X-ray reviewed by me and shows no evidence of fracture or dislocation. Given her abdominal tenderness, we will further assess with CBC, CMP, lipase, UA, urine pregnancy, and CT abdomen/pelvis. We will treat symptomatically with IV fentanyl.  Urine pregnancy is  negative, UA with no evidence of infection.  Lab work is reassuring, LFTs and lipase within normal limits.  CT scan does show enlarged myomatous uterus which could be contributing to her pain as well as right-sided ovarian cyst.  We will further assess with ultrasound to ensure no torsion.  Pelvic exam also performed and shows scant blood but no cervical motion or Nexa tenderness.  Ultrasound shows uterine fibroids but no evidence of torsion.  Patient much more calm and comfortable on reassessment, pain now primarily seems to be present with range of motion of her right hip.  Given no recent traumatic mechanism, doubt occult fracture.  Will prescribe muscle relaxant and lidocaine patches for patient's hip pain, she was also provided with referral to OB/GYN for pelvic pain likely associated with fibroids.  CT scan showed potential left renal scar needing follow-up MRI, patient given referral to PCP and advised of this finding.  Wet prep  positive for trichomonas, which was treated with Flagyl.  Patient is appropriate for discharge home with appropriate follow-up, was counseled to return to the ED for new or worsening symptoms.  Patient agrees with plan.      ____________________________________________   FINAL CLINICAL IMPRESSION(S) / ED DIAGNOSES  Final diagnoses:  Pelvic pain  Right hip pain  Uterine leiomyoma, unspecified location     ED Discharge Orders         Ordered    cyclobenzaprine (FLEXERIL) 5 MG tablet  3 times daily PRN        03/29/20 1126    lidocaine (LIDODERM) 5 %  Every 12 hours        03/29/20 1126           Note:  This document was prepared using Dragon voice recognition software and may include unintentional dictation errors.   Blake Divine, MD 03/29/20 1130

## 2020-03-29 NOTE — ED Notes (Signed)
Pt requesting help to move right leg when getting OOB to wheel chair. Pt able to transfer self from chair to toilet. Pt stating she has a lot of pain when she moves right leg.

## 2020-03-29 NOTE — ED Notes (Signed)
Pt spoke with pt's friend Jenny Reichmann with pt permission. John states he can pick up pt in 20 minutes.

## 2020-03-29 NOTE — ED Notes (Signed)
Patient transported to CT 

## 2020-03-29 NOTE — ED Notes (Signed)
Patient transported to Ultrasound 

## 2020-03-29 NOTE — ED Notes (Signed)
Pt given pad and mesh underwear per request. Pt helped to dress per pt's request

## 2020-03-29 NOTE — ED Triage Notes (Signed)
t arrived via EMS from home where she called out due to sudden onset of shooting pain in her groin, radiating across her right hip. Pt able to stand but not without pain. Pt in tears and holding area. Pt denies injury. No shortening or rotation noted.Adriana Harrison

## 2020-08-19 ENCOUNTER — Emergency Department: Payer: Self-pay

## 2020-08-19 ENCOUNTER — Encounter: Payer: Self-pay | Admitting: Emergency Medicine

## 2020-08-19 ENCOUNTER — Emergency Department
Admission: EM | Admit: 2020-08-19 | Discharge: 2020-08-19 | Disposition: A | Discharge status: Home / Self Care | Payer: Self-pay | Attending: Emergency Medicine | Admitting: Emergency Medicine

## 2020-08-19 ENCOUNTER — Other Ambulatory Visit: Payer: Self-pay

## 2020-08-19 DIAGNOSIS — M779 Enthesopathy, unspecified: Secondary | ICD-10-CM | POA: Insufficient documentation

## 2020-08-19 DIAGNOSIS — F17200 Nicotine dependence, unspecified, uncomplicated: Secondary | ICD-10-CM | POA: Insufficient documentation

## 2020-08-19 DIAGNOSIS — N179 Acute kidney failure, unspecified: Secondary | ICD-10-CM | POA: Insufficient documentation

## 2020-08-19 DIAGNOSIS — M79641 Pain in right hand: Secondary | ICD-10-CM | POA: Insufficient documentation

## 2020-08-19 LAB — CBC WITH DIFFERENTIAL/PLATELET
Abs Immature Granulocytes: 0.08 10*3/uL — ABNORMAL HIGH (ref 0.00–0.07)
Basophils Absolute: 0.1 10*3/uL (ref 0.0–0.1)
Basophils Relative: 1 %
Eosinophils Absolute: 0 10*3/uL (ref 0.0–0.5)
Eosinophils Relative: 0 %
HCT: 46.6 % — ABNORMAL HIGH (ref 36.0–46.0)
Hemoglobin: 15.4 g/dL — ABNORMAL HIGH (ref 12.0–15.0)
Immature Granulocytes: 1 %
Lymphocytes Relative: 13 %
Lymphs Abs: 2.3 10*3/uL (ref 0.7–4.0)
MCH: 27.2 pg (ref 26.0–34.0)
MCHC: 33 g/dL (ref 30.0–36.0)
MCV: 82.3 fL (ref 80.0–100.0)
Monocytes Absolute: 0.8 10*3/uL (ref 0.1–1.0)
Monocytes Relative: 4 %
Neutro Abs: 14.3 10*3/uL — ABNORMAL HIGH (ref 1.7–7.7)
Neutrophils Relative %: 81 %
Platelets: 418 10*3/uL — ABNORMAL HIGH (ref 150–400)
RBC: 5.66 MIL/uL — ABNORMAL HIGH (ref 3.87–5.11)
RDW: 15 % (ref 11.5–15.5)
WBC: 17.6 10*3/uL — ABNORMAL HIGH (ref 4.0–10.5)
nRBC: 0 % (ref 0.0–0.2)

## 2020-08-19 LAB — BASIC METABOLIC PANEL
Anion gap: 10 (ref 5–15)
BUN: 21 mg/dL — ABNORMAL HIGH (ref 6–20)
CO2: 21 mmol/L — ABNORMAL LOW (ref 22–32)
Calcium: 8.6 mg/dL — ABNORMAL LOW (ref 8.9–10.3)
Chloride: 105 mmol/L (ref 98–111)
Creatinine, Ser: 1.14 mg/dL — ABNORMAL HIGH (ref 0.44–1.00)
GFR, Estimated: >60 mL/min (ref >60)
Glucose, Bld: 111 mg/dL — ABNORMAL HIGH (ref 70–99)
Potassium: 4.3 mmol/L (ref 3.5–5.1)
Sodium: 136 mmol/L (ref 135–145)

## 2020-08-19 LAB — CK: Total CK: 105 U/L (ref 38–234)

## 2020-08-19 LAB — SEDIMENTATION RATE: Sed Rate: 3 mm/hr (ref 0–20)

## 2020-08-19 MED ORDER — HYDROMORPHONE HCL 1 MG/ML IJ SOLN
0.5000 mg | Freq: Once | INTRAMUSCULAR | Status: AC
  Administered 2020-08-19: 05:00:00 0.5 mg via INTRAVENOUS
  Filled 2020-08-19: qty 1

## 2020-08-19 MED ORDER — METHYLPREDNISOLONE 4 MG PO TBPK: ORAL_TABLET | ORAL | 0 refills | Status: DC

## 2020-08-19 MED ORDER — OXYCODONE-ACETAMINOPHEN 5-325 MG PO TABS
2.0000 | ORAL_TABLET | Freq: Once | ORAL | Status: AC
Start: 2020-08-19 — End: 2020-08-19
  Administered 2020-08-19: 06:00:00 2 via ORAL
  Filled 2020-08-19: qty 2

## 2020-08-19 MED ORDER — KETOROLAC TROMETHAMINE 30 MG/ML IJ SOLN
15.0000 mg | Freq: Once | INTRAMUSCULAR | Status: AC
  Administered 2020-08-19: 05:00:00 15 mg via INTRAVENOUS
  Filled 2020-08-19: qty 1

## 2020-08-19 MED ORDER — OXYCODONE-ACETAMINOPHEN 10-325 MG PO TABS: 1.0000 | ORAL_TABLET | Freq: Four times a day (QID) | ORAL | 0 refills | Status: DC | PRN

## 2020-08-19 MED ORDER — SODIUM CHLORIDE 0.9 % IV BOLUS
1000.0000 mL | Freq: Once | INTRAVENOUS | Status: AC
  Administered 2020-08-19: 05:00:00 1000 mL via INTRAVENOUS

## 2020-08-19 MED ORDER — OXYCODONE-ACETAMINOPHEN 5-325 MG PO TABS
1.0000 | ORAL_TABLET | Freq: Once | ORAL | Status: DC
Start: 2020-08-19 — End: 2020-08-19
  Filled 2020-08-19: qty 1

## 2020-08-19 MED ORDER — IBUPROFEN 800 MG PO TABS: 800.0000 mg | ORAL_TABLET | Freq: Three times a day (TID) | ORAL | 0 refills | Status: DC | PRN

## 2020-08-19 MED ORDER — METHYLPREDNISOLONE SODIUM SUCC 125 MG IJ SOLR
80.0000 mg | Freq: Once | INTRAMUSCULAR | Status: AC
  Administered 2020-08-19: 05:00:00 80 mg via INTRAVENOUS
  Filled 2020-08-19: qty 2

## 2020-08-19 NOTE — ED Provider Notes (Signed)
[facility] Emergency Department Provider Note   ____________________________________________   Event Date/Time   First MD Initiated Contact with Patient 08/19/20 [phone]     (approximate)  I have reviewed the triage vital signs and the nursing notes.   HISTORY  Chief Complaint Arm Pain    HPI Adriana Harrison is a 45 y.o. female who presents to the ED from home with a chief complaint of nontraumatic right hand pain.  Patient reports since 6 PM having pain to the palmar aspect of her right hand.  Awoke her from sleep prior to arrival with sharp shooting pains radiating into her right forearm.  Denies extremity weakness, numbness or tingling.  Denies injury/trauma, repetitive motions.  States she used to be a [employer].     Past medical history None  There are no problems to display for this patient.   History reviewed. No pertinent surgical history.  Prior to Admission medications   Medication Sig Start Date End Date Taking? Authorizing Provider  ibuprofen (ADVIL) 800 MG tablet Take 1 tablet (800 mg total) by mouth every 8 (eight) hours as needed for moderate pain. 08/19/20  Yes Paulette Blanch, MD  methylPREDNISolone (MEDROL DOSEPAK) 4 MG TBPK tablet Take as directed 08/19/20  Yes Paulette Blanch, MD  oxyCODONE-acetaminophen (PERCOCET) 10-325 MG tablet Take 1 tablet by mouth every 6 (six) hours as needed for pain. 08/19/20  Yes Paulette Blanch, MD  cyclobenzaprine (FLEXERIL) 5 MG tablet Take 1 tablet (5 mg total) by mouth 3 (three) times daily as needed for muscle spasms. 03/29/20   Blake Divine, MD  lidocaine (LIDODERM) 5 % Place 1 patch onto the skin every 12 (twelve) hours. Remove & Discard patch within 12 hours or as directed by MD 03/29/20 03/29/21  Blake Divine, MD  naproxen (NAPROSYN) 500 MG tablet Take 1 tablet (500 mg total) by mouth 2 (two) times daily with a meal. 10/23/19   Sable Feil, PA-C  traMADol (ULTRAM) 50 MG tablet Take 1 tablet (50 mg total)  by mouth every 6 (six) hours as needed. 10/23/19 10/22/20  Sable Feil, PA-C    Allergies Patient has no known allergies.  No family history on file.  Social History Social History   Tobacco Use  . Smoking status: Current Every Day Smoker  . Smokeless tobacco: Never Used  Vaping Use  . Vaping Use: Never used  Substance Use Topics  . Alcohol use: Yes  . Drug use: Yes    Types: Marijuana    Review of Systems  Constitutional: No fever/chills Eyes: No visual changes. ENT: No sore throat. Cardiovascular: Denies chest pain. Respiratory: Denies shortness of breath. Gastrointestinal: No abdominal pain.  No nausea, no vomiting.  No diarrhea.  No constipation. Genitourinary: Negative for dysuria. Musculoskeletal: Positive for right hand pain.  Negative for back pain. Skin: Negative for rash. Neurological: Negative for headaches, focal weakness or numbness.   ____________________________________________   PHYSICAL EXAM:  VITAL SIGNS: ED Triage Vitals  Enc Vitals Group     BP 08/19/20 0407 (!) 141/109     Pulse Rate 08/19/20 0407 100     Resp 08/19/20 0407 20     Temp 08/19/20 0407 98.5 F (36.9 C)     Temp Source 08/19/20 0407 Oral     SpO2 08/19/20 0407 96 %     Weight 08/19/20 0404 212 lb (96.2 kg)     Height 08/19/20 0404 5\' 2"  (1.575 m)     Head Circumference --  Peak Flow --      Pain Score 08/19/20 0408 10     Pain Loc --      Pain Edu? --      Excl. in [place]? --     Constitutional: Alert and oriented.  Uncomfortable appearing and in moderate acute distress. Eyes: Conjunctivae are normal. PERRL. EOMI. Head: Atraumatic. Nose: No congestion/rhinnorhea. Mouth/Throat: Mucous membranes are moist.   Neck: No stridor.   Cardiovascular: Normal rate, regular rhythm. Grossly normal heart sounds.  Good peripheral circulation. Respiratory: Normal respiratory effort.  No retractions. Lungs CTAB. Gastrointestinal: Soft and nontender. No distention. No abdominal  bruits. No CVA tenderness. Musculoskeletal:  RUE: Right hand contractured.  Pain on extending fingers.  Patient reports pain is to her palm.  No tenderness to palpation.  No external evidence of injury.  No evidence of septic joints.  There is no warmth, erythema or fluctuance.  2+ radial pulse.  Brisk, less than 5-second capillary refill. No lower extremity tenderness nor edema.  No joint effusions. Neurologic:  Normal speech and language. No gross focal neurologic deficits are appreciated. No gait instability. Skin:  Skin is warm, dry and intact. No rash noted. Psychiatric: Mood and affect are tearful. Speech and behavior are normal.  ____________________________________________   LABS (all labs ordered are listed, but only abnormal results are displayed)  Labs Reviewed  CBC WITH DIFFERENTIAL/PLATELET - Abnormal; Notable for the following components:      Result Value   WBC 17.6 (*)    RBC 5.66 (*)    Hemoglobin 15.4 (*)    HCT 46.6 (*)    Platelets 418 (*)    Neutro Abs 14.3 (*)    Abs Immature Granulocytes 0.08 (*)    All other components within normal limits  BASIC METABOLIC PANEL - Abnormal; Notable for the following components:   CO2 21 (*)    Glucose, Bld 111 (*)    BUN 21 (*)    Creatinine, Ser 1.14 (*)    Calcium 8.6 (*)    All other components within normal limits  CK  SEDIMENTATION RATE   ____________________________________________  EKG  None ____________________________________________  RADIOLOGY I, Alika Saladin J, personally viewed and evaluated these images (plain radiographs) as part of my medical decision making, as well as reviewing the written report by the radiologist.  ED MD interpretation: Unremarkable x-ray  Official radiology report(s): DG Hand Complete Right  Result Date: 08/19/2020 CLINICAL DATA:  Hand pain, atraumatic EXAM: RIGHT HAND - COMPLETE 3+ VIEW COMPARISON:  None. FINDINGS: There is no evidence of fracture or dislocation. There is no  evidence of arthropathy or other focal bone abnormality. Soft tissues are unremarkable. IMPRESSION: Negative. Electronically Signed   By: Monte Fantasia M.D.   On: 08/19/2020 04:57    ____________________________________________   PROCEDURES  Procedure(s) performed (including Critical Care):  Procedures   ____________________________________________   INITIAL IMPRESSION / ASSESSMENT AND PLAN / ED COURSE  As part of my medical decision making, I reviewed the following data within the [place] notes reviewed and incorporated, Labs reviewed, Old chart reviewed, Notes from prior ED visits and [place] Controlled Substance Database     45 year old female presenting with nontraumatic right hand pain.  Will obtain basic lab work, administer IV Toradol and Dilaudid, obtain x-rays and reassess.  Clinical Course as of 08/19/20 0705  Wed Aug 19, 2020  0530 Laboratory results notable for leukocytosis, AKI/dehydration, normal CK and sed rate.  There is no clinical evidence for  hand infection.  Suspect tendinitis.  Will administer oral Percocet at this time.  Will discharge home with prescriptions for Medrol Dosepak, Motrin and Percocet.  Encourage patient to see hand surgeon at [facility] in the next 1 to 2 days.  Strict return precautions given.  Patient verbalizes understanding agrees with plan of care. [JS]    Clinical Course User Index [JS] Paulette Blanch, MD     ____________________________________________   FINAL CLINICAL IMPRESSION(S) / ED DIAGNOSES  Final diagnoses:  Right hand pain  Tendonitis  AKI (acute kidney injury) [facility])     ED Discharge Orders         Ordered    methylPREDNISolone (MEDROL DOSEPAK) 4 MG TBPK tablet        08/19/20 0531    ibuprofen (ADVIL) 800 MG tablet  Every 8 hours PRN        08/19/20 0531    oxyCODONE-acetaminophen (PERCOCET) 10-325 MG tablet  Every 6 hours PRN        08/19/20 0531           Note:  This document was  prepared using Dragon voice recognition software and may include unintentional dictation errors.   Paulette Blanch, MD 08/19/20 [phone]

## 2020-08-19 NOTE — Discharge Instructions (Addendum)
1.  Take steroid taper as prescribed. 2.  You may take pain medicines as needed (Motrin/Percocet). 3.  Wear splint as needed for comfort. 4.  Return to the ER for worsening symptoms, persistent vomiting, fever, difficulty breathing or other concerns.

## 2020-08-19 NOTE — ED Triage Notes (Signed)
Patient ambulatory to triage with steady gait, without difficulty, tearful; pt reports since 6pm having pain to rt hand that has increased in intensity extending into FA; denies any known injury, no swelling noted

## 2021-02-22 ENCOUNTER — Other Ambulatory Visit: Payer: Self-pay

## 2021-03-16 ENCOUNTER — Ambulatory Visit (LOCAL_COMMUNITY_HEALTH_CENTER): Payer: Self-pay

## 2021-03-16 ENCOUNTER — Other Ambulatory Visit: Payer: Self-pay

## 2021-03-16 DIAGNOSIS — Z111 Encounter for screening for respiratory tuberculosis: Secondary | ICD-10-CM

## 2021-03-19 ENCOUNTER — Other Ambulatory Visit: Payer: Self-pay

## 2021-04-09 ENCOUNTER — Ambulatory Visit (LOCAL_COMMUNITY_HEALTH_CENTER): Payer: Self-pay

## 2021-04-09 ENCOUNTER — Other Ambulatory Visit: Payer: Self-pay

## 2021-04-09 DIAGNOSIS — Z111 Encounter for screening for respiratory tuberculosis: Secondary | ICD-10-CM

## 2021-04-12 ENCOUNTER — Other Ambulatory Visit: Payer: Self-pay

## 2021-04-12 ENCOUNTER — Emergency Department
Admission: EM | Admit: 2021-04-12 | Discharge: 2021-04-12 | Disposition: A | Discharge status: Home / Self Care | Payer: Self-pay | Attending: Emergency Medicine | Admitting: Emergency Medicine

## 2021-04-12 ENCOUNTER — Encounter: Payer: Self-pay | Admitting: Emergency Medicine

## 2021-04-12 ENCOUNTER — Emergency Department: Payer: Self-pay

## 2021-04-12 DIAGNOSIS — M25571 Pain in right ankle and joints of right foot: Secondary | ICD-10-CM | POA: Insufficient documentation

## 2021-04-12 DIAGNOSIS — F17200 Nicotine dependence, unspecified, uncomplicated: Secondary | ICD-10-CM | POA: Insufficient documentation

## 2021-04-12 DIAGNOSIS — Z20822 Contact with and (suspected) exposure to covid-19: Secondary | ICD-10-CM | POA: Insufficient documentation

## 2021-04-12 DIAGNOSIS — J069 Acute upper respiratory infection, unspecified: Secondary | ICD-10-CM | POA: Insufficient documentation

## 2021-04-12 LAB — RESP PANEL BY RT-PCR (FLU A&B, COVID) ARPGX2
Influenza A by PCR: NEGATIVE
Influenza B by PCR: NEGATIVE
SARS Coronavirus 2 by RT PCR: NEGATIVE

## 2021-04-12 MED ORDER — PSEUDOEPH-BROMPHEN-DM 30-2-10 MG/5ML PO SYRP: 5.0000 mL | ORAL_SOLUTION | Freq: Four times a day (QID) | ORAL | 0 refills | Status: DC | PRN

## 2021-04-12 MED ORDER — IBUPROFEN 600 MG PO TABS
600.0000 mg | ORAL_TABLET | Freq: Once | ORAL | Status: AC
  Administered 2021-04-12: 600 mg via ORAL
  Filled 2021-04-12: qty 1

## 2021-04-12 MED ORDER — NAPROXEN 500 MG PO TABS: 500.0000 mg | ORAL_TABLET | Freq: Two times a day (BID) | ORAL | 1 refills | Status: DC

## 2021-04-12 NOTE — ED Triage Notes (Signed)
Pt via POV from home. Pt c/o cough and generalized body aches. Denies NVD. Denies fever. Pt is A&OX4 and NAD

## 2021-04-12 NOTE — Discharge Instructions (Addendum)
Follow-up with your primary care provider if any continued problems or concerns.  A prescription for cough and congestion was sent to your pharmacy along with an anti-inflammatory to take twice a day with food for your ankle/heel pain.  Wear good supportive shoes anytime you are walking.  If not improving in 2 weeks you should call the podiatrist listed on your discharge papers and make an appointment for further evaluation and imaging.

## 2021-04-12 NOTE — ED Provider Notes (Signed)
[facility] Provider Note    Event Date/Time   First MD Initiated Contact with Patient 04/12/21 [phone]     (approximate)   History   Generalized Body Aches and Cough   HPI  Adriana Harrison is a 46 y.o. female   presents to the ED with complaint of cough and generalized body aches that started over the weekend.  Patient denies any fever, chills, nausea, vomiting or diarrhea.  Patient does smoke daily.  She does complain of joint aches.  She also gives a history of right ankle pain that started approximately 1 month ago.  Patient denies any injury to her ankle and this is her initial evaluation for this.  She rates the aches and pains from her respiratory illness as a 10/10.      Physical Exam   Triage Vital Signs: ED Triage Vitals  Enc Vitals Group     BP 04/12/21 0806 (!) 127/100     Pulse Rate 04/12/21 0806 83     Resp 04/12/21 0806 18     Temp 04/12/21 0806 98 F (36.7 C)     Temp Source 04/12/21 0806 Oral     SpO2 04/12/21 0806 97 %     Weight 04/12/21 0805 195 lb (88.5 kg)     Height 04/12/21 0805 5\' 4"  (1.626 m)     Head Circumference --      Peak Flow --      Pain Score 04/12/21 0804 10     Pain Loc --      Pain Edu? --      Excl. in [place]? --     Most recent vital signs: Vitals:   04/12/21 0806 04/12/21 0931  BP: (!) 127/100 124/89  Pulse: 83 80  Resp: 18 18  Temp: 98 F (36.7 C)   SpO2: 97% 98%     General: Awake, no distress.  CV:  Good peripheral perfusion.  Heart regular rate and rhythm without murmur. Resp:  Normal effort.  Lungs are clear bilaterally. Abd:  No distention.  Other:  Right ankle posteriorly has a area of tenderness and soft tissue edema.  No erythema or warmth is noted.  Patient can flex and extend her foot completely without any increased pain.  Skin is intact.  There is a firmness noted to the Achilles tendon and insertion into the calcaneus.   ED Results / Procedures / Treatments   Labs (all labs  ordered are listed, but only abnormal results are displayed) Labs Reviewed  RESP PANEL BY RT-PCR (FLU A&B, COVID) [id number]      RADIOLOGY X-ray of the right ankle was reviewed and calcaneal spurs were noted.  Radiology report was reviewed and noted calcaneal and plantar spurs were noted.  There is also a spur was noted on the first metatarsal head.    PROCEDURES:  Critical Care performed:   Procedures   MEDICATIONS ORDERED IN ED: Medications  ibuprofen (ADVIL) tablet 600 mg (600 mg Oral Given 04/12/21 0947)     IMPRESSION / MDM / ASSESSMENT AND PLAN / ED COURSE  I reviewed the triage vital signs and the nursing notes.   Differential diagnosis includes, but is not limited to, influenza, COVID, bronchitis, right ankle pain, tendinitis.  46 year old female presents to the ED today with complaint of cough and congestion that started recently.  Patient denies any nausea, vomiting or diarrhea.  She also lanes of generalized body aches and particularly complains of her right ankle pain that  has been going on for greater than 1 month.  She denies any injury to her ankle.  She has not been taking any medications such as anti-inflammatories since her illness or her ankle pain.  Physical exam was benign with the exception of moderate tenderness on palpation of the right ankle posteriorly and the calcaneal region plantar aspect.  No erythema, warmth or evidence of injury was noted.  Patient had full range of flexion and extension without any difficulty and is ambulatory without any assistance.  X-ray showed spurs both on the calcaneus and plantar aspect.  Patient was given a prescription for naproxen with instructions to take this with food.  A prescription for Bromfed-DM was sent to her pharmacy as needed for cough and congestion.  If she continues with ankle or foot pain she is to follow-up with Dr.Sikora who is on-call for podiatry today.    FINAL CLINICAL IMPRESSION(S) / ED DIAGNOSES    Final diagnoses:  Viral URI with cough  Right ankle pain, unspecified chronicity     Rx / DC Orders   ED Discharge Orders          Ordered    naproxen (NAPROSYN) 500 MG tablet  2 times daily with meals        04/12/21 1044    brompheniramine-pseudoephedrine-DM 30-2-10 MG/5ML syrup  4 times daily PRN        04/12/21 1045             Note:  This document was prepared using Dragon voice recognition software and may include unintentional dictation errors.   Johnn Hai, PA-C 04/12/21 1154    Rada Hay, MD 04/12/21 [phone]

## 2021-04-13 ENCOUNTER — Telehealth: Payer: Self-pay | Admitting: Family Medicine

## 2021-04-13 NOTE — Telephone Encounter (Signed)
Pt missed her TB Reading appt yesterday because she was at the Emergency room. 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 Please call her back. Thanks

## 2021-04-14 NOTE — Telephone Encounter (Signed)
Attempted TC to patient.  Adriana Harrison states she is at work and not available.  Asked him to have patient call [facility].  This is 2nd no show for TB skin test read.  Patient will need to schedule a PPD placement appt and pay $21. Unfortunately we can't read a negative PPD a day late.  If positive, we could possibly read.Aileen Fass, RN

## 2021-10-16 ENCOUNTER — Emergency Department: Payer: Self-pay

## 2021-10-16 ENCOUNTER — Encounter: Payer: Self-pay | Admitting: Emergency Medicine

## 2021-10-16 ENCOUNTER — Other Ambulatory Visit: Payer: Self-pay

## 2021-10-16 ENCOUNTER — Emergency Department
Admission: EM | Admit: 2021-10-16 | Discharge: 2021-10-16 | Disposition: A | Discharge status: Home / Self Care | Payer: Self-pay | Attending: Emergency Medicine | Admitting: Emergency Medicine

## 2021-10-16 DIAGNOSIS — M25562 Pain in left knee: Secondary | ICD-10-CM | POA: Insufficient documentation

## 2021-10-16 DIAGNOSIS — M7021 Olecranon bursitis, right elbow: Secondary | ICD-10-CM | POA: Insufficient documentation

## 2021-10-16 DIAGNOSIS — M7030 Other bursitis of elbow, unspecified elbow: Secondary | ICD-10-CM

## 2021-10-16 DIAGNOSIS — M25561 Pain in right knee: Secondary | ICD-10-CM | POA: Insufficient documentation

## 2021-10-16 DIAGNOSIS — Y939 Activity, unspecified: Secondary | ICD-10-CM | POA: Insufficient documentation

## 2021-10-16 DIAGNOSIS — M7022 Olecranon bursitis, left elbow: Secondary | ICD-10-CM | POA: Insufficient documentation

## 2021-10-16 LAB — BASIC METABOLIC PANEL
Anion gap: 8 (ref 5–15)
BUN: 17 mg/dL (ref 6–20)
CO2: 23 mmol/L (ref 22–32)
Calcium: 9 mg/dL (ref 8.9–10.3)
Chloride: 108 mmol/L (ref 98–111)
Creatinine, Ser: 0.78 mg/dL (ref 0.44–1.00)
GFR, Estimated: >60 mL/min (ref >60)
Glucose, Bld: 131 mg/dL — ABNORMAL HIGH (ref 70–99)
Potassium: 4.1 mmol/L (ref 3.5–5.1)
Sodium: 139 mmol/L (ref 135–145)

## 2021-10-16 LAB — CBC WITH DIFFERENTIAL/PLATELET
Abs Immature Granulocytes: 0.04 10*3/uL (ref 0.00–0.07)
Basophils Absolute: 0.1 10*3/uL (ref 0.0–0.1)
Basophils Relative: 1 %
Eosinophils Absolute: 0.1 10*3/uL (ref 0.0–0.5)
Eosinophils Relative: 1 %
HCT: 47.8 % — ABNORMAL HIGH (ref 36.0–46.0)
Hemoglobin: 15.5 g/dL — ABNORMAL HIGH (ref 12.0–15.0)
Immature Granulocytes: 0 %
Lymphocytes Relative: 26 %
Lymphs Abs: 2.9 10*3/uL (ref 0.7–4.0)
MCH: 27 pg (ref 26.0–34.0)
MCHC: 32.4 g/dL (ref 30.0–36.0)
MCV: 83.1 fL (ref 80.0–100.0)
Monocytes Absolute: 0.6 10*3/uL (ref 0.1–1.0)
Monocytes Relative: 5 %
Neutro Abs: 7.6 10*3/uL (ref 1.7–7.7)
Neutrophils Relative %: 67 %
Platelets: 418 10*3/uL — ABNORMAL HIGH (ref 150–400)
RBC: 5.75 MIL/uL — ABNORMAL HIGH (ref 3.87–5.11)
RDW: 15.2 % (ref 11.5–15.5)
WBC: 11.3 10*3/uL — ABNORMAL HIGH (ref 4.0–10.5)
nRBC: 0 % (ref 0.0–0.2)

## 2021-10-16 LAB — SEDIMENTATION RATE: Sed Rate: 6 mm/hr (ref 0–22)

## 2021-10-16 LAB — URIC ACID: Uric Acid, Serum: 6.6 mg/dL (ref 2.5–7.1)

## 2021-10-16 MED ORDER — IBUPROFEN 600 MG PO TABS
600.0000 mg | ORAL_TABLET | Freq: Once | ORAL | Status: AC
  Administered 2021-10-16: 600 mg via ORAL
  Filled 2021-10-16: qty 1

## 2021-10-16 MED ORDER — MELOXICAM 15 MG PO TABS: 15.0000 mg | ORAL_TABLET | Freq: Every day | ORAL | 2 refills | Status: DC

## 2021-10-16 NOTE — ED Notes (Signed)
See triage note. Pt to ED with acute on chronic bilateral knee pain, no known injury, pain is present since 6 months, worse since 2 days. Pt complaining about wait time and asking for pillow.

## 2021-10-16 NOTE — Discharge Instructions (Addendum)
Follow-up with your regular doctor if not improving 3 days.  Return for worsening.  Take medication as prescribed.  Follow-up with orthopedics.

## 2021-10-16 NOTE — ED Triage Notes (Signed)
Patient to ED for bilateral knee pain for the past 6-7 months. No known injury. Patient also c/o of bilateral growths on elbows x2 months- started off as itch and has gradually gotten bigger.

## 2021-10-16 NOTE — ED Notes (Signed)
Drew blood on pt. Pt requesting more warm blankets, chicken noodle soup, more crackers, and jerked arm away with IV placement and stated loudly "why you gotta be so rough!". Pt had easy IV placement with 22g to L AC.

## 2021-10-16 NOTE — ED Notes (Signed)
Upon this RN arrival to bedside to discharge patient, pt found to be sleeping soundly in bed with lights dimmed. Pt awakens after several verbal attempts to wake patient up. This RN explained here to discharge patient. Pt states "they ain't give me nothing for fucking pain". This RN spoke with Manuela Schwartz, PA-C, orders placed for '600mg'$  Motrin. This RN explained pt being discharged with anti-inflammatory medication for pain. Pt not satisfied with this RN response. Pt repeatedly stating, "that's all they gone give me for fucking pain is some ibuprofen?I could have laid at the house and been in pain like this". This RN offered several times to assist patient with getting dressed and with wheelchair. Pt ignored this RN offering assistance, before finally stating, "yes I'll need a wheelchair, I can barely fucking walk". This RN reviewed patient's discharge instructions, removed pt's IV, administered medications as ordered after patient initially refused, and took patient to lobby via wheelchair to use courtesy phone to call for her ride. NAD noted at time of discharge.

## 2021-10-16 NOTE — ED Triage Notes (Signed)
Patient to ER for bilateral knee pain x5-6 months. Patient denies any known injury.

## 2021-10-16 NOTE — ED Provider Notes (Signed)
[facility] Provider Note    Event Date/Time   First MD Initiated Contact with Patient 10/16/21 1633     (approximate)   History   Knee Pain   HPI  Adriana Harrison is a 46 y.o. female with no significant past medical history who presents for bilateral knee and elbow pain.  States pains been ongoing for 6 7 months.  Noticed bilateral Gross on the elbows.  States she does not understand why she has knee pain all of the time.  Has not followed up with anyone after being seen here previously      Physical Exam   Triage Vital Signs: ED Triage Vitals [10/16/21 1404]  Enc Vitals Group     BP 120/75     Pulse Rate 87     Resp 18     Temp 98.4 F (36.9 C)     Temp Source Oral     SpO2 100 %     Weight 190 lb (86.2 kg)     Height '5\' 4"'$  (1.626 m)     Head Circumference      Peak Flow      Pain Score 10     Pain Loc      Pain Edu?      Excl. in [place]?     Most recent vital signs: Vitals:   10/16/21 1404 10/16/21 1934  BP: 120/75 121/71  Pulse: 87 79  Resp: 18 20  Temp: 98.4 F (36.9 C)   SpO2: 100% 96%     General: Awake, no distress.   CV:  Good peripheral perfusion. regular rate and  rhythm Resp:  Normal effort.  Abd:  No distention.   Other:  Fluctuant areas noted on the elbows bilaterally, small nodules noted in the fluctuant area, but is tender bilaterally   ED Results / Procedures / Treatments   Labs (all labs ordered are listed, but only abnormal results are displayed) Labs Reviewed  CBC WITH DIFFERENTIAL/PLATELET - Abnormal; Notable for the following components:      Result Value   WBC 11.3 (*)    RBC 5.75 (*)    Hemoglobin 15.5 (*)    HCT 47.8 (*)    Platelets 418 (*)    All other components within normal limits  BASIC METABOLIC PANEL - Abnormal; Notable for the following components:   Glucose, Bld 131 (*)    All other components within normal limits  SEDIMENTATION RATE  URIC ACID     EKG     RADIOLOGY X-ray  of the right and left knee, x-ray of the right elbow    PROCEDURES:   Procedures   MEDICATIONS ORDERED IN ED: Medications  ibuprofen (ADVIL) tablet 600 mg (600 mg Oral Given 10/16/21 1941)     IMPRESSION / MDM / ASSESSMENT AND PLAN / ED COURSE  I reviewed the triage vital signs and the nursing notes.                              Differential diagnosis includes, but is not limited to, arthritis, bursitis, gout  Patient's presentation is most consistent with acute complicated illness / injury requiring diagnostic workup.   Patient's labs are reassuring.  X-ray of right and left knee independently reviewed and interpreted by me as being positive for arthritis.  X-ray of the right elbow is negative for fracture or osteomyelitis.  I did explain these findings to the patient.  She is follow-up with orthopedics.  Is given prescription for meloxicam.  Patient has been very demanding while here in the ED.  She has been fed multiple times along with given pain medication.  However I had to shake her to wake her up when  I walked into the room.  Explained to her that she can have any anti-inflammatory.  She need to see orthopedics.  Follow-up with them.  She was discharged stable condition.      FINAL CLINICAL IMPRESSION(S) / ED DIAGNOSES   Final diagnoses:  Pain in both knees, unspecified chronicity  Bursitis of elbow, unspecified bursa, unspecified laterality     Rx / DC Orders   ED Discharge Orders          Ordered    meloxicam (MOBIC) 15 MG tablet  Daily        10/16/21 1913             Note:  This document was prepared using Dragon voice recognition software and may include unintentional dictation errors.    Versie Starks, PA-C 10/16/21 Doloris Hall, MD 10/16/21 2039

## 2021-10-29 ENCOUNTER — Emergency Department
Admission: EM | Admit: 2021-10-29 | Discharge: 2021-10-29 | Disposition: A | Discharge status: Home / Self Care | Payer: Self-pay | Attending: Emergency Medicine | Admitting: Emergency Medicine

## 2021-10-29 ENCOUNTER — Emergency Department: Payer: Self-pay

## 2021-10-29 ENCOUNTER — Encounter: Payer: Self-pay | Admitting: Emergency Medicine

## 2021-10-29 ENCOUNTER — Other Ambulatory Visit: Payer: Self-pay

## 2021-10-29 DIAGNOSIS — M7121 Synovial cyst of popliteal space [Baker], right knee: Secondary | ICD-10-CM | POA: Insufficient documentation

## 2021-10-29 MED ORDER — IBUPROFEN 800 MG PO TABS
800.0000 mg | ORAL_TABLET | Freq: Once | ORAL | Status: AC
  Administered 2021-10-29: 800 mg via ORAL
  Filled 2021-10-29: qty 1

## 2021-10-29 MED ORDER — IBUPROFEN 800 MG PO TABS: 800.0000 mg | ORAL_TABLET | Freq: Three times a day (TID) | ORAL | 0 refills | Status: AC | PRN

## 2021-10-29 NOTE — Discharge Instructions (Signed)
Baker's cyst is a cyst within the compartment of your knee.  This may be causing the pain and swelling inside the knee.  Take the ibuprofen as prescribed.  Apply ice.  [facility]

## 2021-10-29 NOTE — ED Triage Notes (Signed)
Pt here via [facility] with right leg pain. Pt states she was here recently for same. Pt states she has gotten much worse, pt moaning while getting into treatment bed.

## 2021-10-29 NOTE — ED Provider Notes (Signed)
[facility] Provider Note    Event Date/Time   First MD Initiated Contact with Patient 10/29/21 [phone]     (approximate)   History   Leg Pain   HPI  Adriana Harrison is a 46 y.o. female with history of chronic leg pain presents emergency department complaining of worsening right leg pain.  Patient states she is taken the meloxicam as prescribed but has not gotten better.  Cannot follow-up with orthopedics as she does not have health insurance.  No new injury.  States there is been some swelling to the right leg.  No fever or chills      Physical Exam   Triage Vital Signs: ED Triage Vitals  Enc Vitals Group     BP 10/29/21 0940 (!) 139/93     Pulse Rate 10/29/21 0940 83     Resp 10/29/21 0940 20     Temp 10/29/21 0938 97.7 F (36.5 C)     Temp Source 10/29/21 0938 Oral     SpO2 10/29/21 0940 95 %     Weight 10/29/21 0938 190 lb 0.6 oz (86.2 kg)     Height 10/29/21 0938 '5\' 4"'$  (1.626 m)     Head Circumference --      Peak Flow --      Pain Score 10/29/21 0938 10     Pain Loc --      Pain Edu? --      Excl. in [place]? --     Most recent vital signs: Vitals:   10/29/21 0938 10/29/21 0940  BP:  (!) 139/93  Pulse:  83  Resp:  20  Temp: 97.7 F (36.5 C)   SpO2:  95%     General: Awake, no distress.   CV:  Good peripheral perfusion. regular rate and  rhythm Resp:  Normal effort Abd:  No distention.   Other:  Right leg tender over the knee at the anterior and posterior, som lower extremity tenderness noted to the right tib-fib, calf tenderness also noted.  Neurovascular is intact   ED Results / Procedures / Treatments   Labs (all labs ordered are listed, but only abnormal results are displayed) Labs Reviewed - No data to display   EKG     RADIOLOGY Ultrasound right lower extremity for DVT    PROCEDURES:   Procedures   MEDICATIONS ORDERED IN ED: Medications  ibuprofen (ADVIL) tablet 800 mg (800 mg Oral Given 10/29/21 1136)      IMPRESSION / MDM / [place] / ED COURSE  I reviewed the triage vital signs and the nursing notes.                              Differential diagnosis includes, but is not limited to, chronic pain, arthritis, DVT  Patient's presentation is most consistent with acute complicated illness / injury requiring diagnostic workup.   Due to the tenderness of the calf and posterior knee we will do a ultrasound for DVT.  I did explain to the patient that we could not prescribe narcotics for this pain.  She would like for me to switch her meloxicam to the ibuprofen 800.  I am agreeable to do this.   Ultrasound right lower extremity for DVT does not show a DVT.  This is interpreted and reviewed by me.  Radiologist also comments that there is a Baker's cyst  I did explain the findings to the patient.  I once again explained to her that only orthopedist can help her with this problem.  She was given a prescription for ibuprofen 800 mg.  Information on Baker's cyst.  Was also given the [facility] because they may be able to help her get in with an orthopedist.  Patient is in agreement treatment plan.  Discharged stable condition.   FINAL CLINICAL IMPRESSION(S) / ED DIAGNOSES   Final diagnoses:  Baker's cyst of knee, right     Rx / DC Orders   ED Discharge Orders          Ordered    ibuprofen (ADVIL) 800 MG tablet  Every 8 hours PRN        10/29/21 1208             Note:  This document was prepared using Dragon voice recognition software and may include unintentional dictation errors.    Versie Starks, PA-C 10/29/21 1212    Blake Divine, MD 10/29/21 [phone]

## 2021-10-29 NOTE — ED Notes (Signed)
See triage note  Presents via EMS with right leg pain since yesterday  Denies any injury  States pain is mainly to anterior leg  Also has some swelling to sole of left foot

## 2021-11-11 ENCOUNTER — Telehealth: Payer: Self-pay

## 2021-11-11 NOTE — Telephone Encounter (Signed)
Called pt from ED referral. Gave info, will call back if interested

## 2022-12-02 ENCOUNTER — Ambulatory Visit (LOCAL_COMMUNITY_HEALTH_CENTER): Payer: Self-pay

## 2022-12-02 DIAGNOSIS — Z111 Encounter for screening for respiratory tuberculosis: Secondary | ICD-10-CM

## 2023-05-09 IMAGING — DX DG ANKLE COMPLETE 3+V*R*
3 series · 3 of 3 positions shown · non-contrast
Comparison: None.

CLINICAL DATA: Right ankle pain for 1 month

EXAM:
RIGHT ANKLE - COMPLETE 3+ VIEW

[ankle ap]
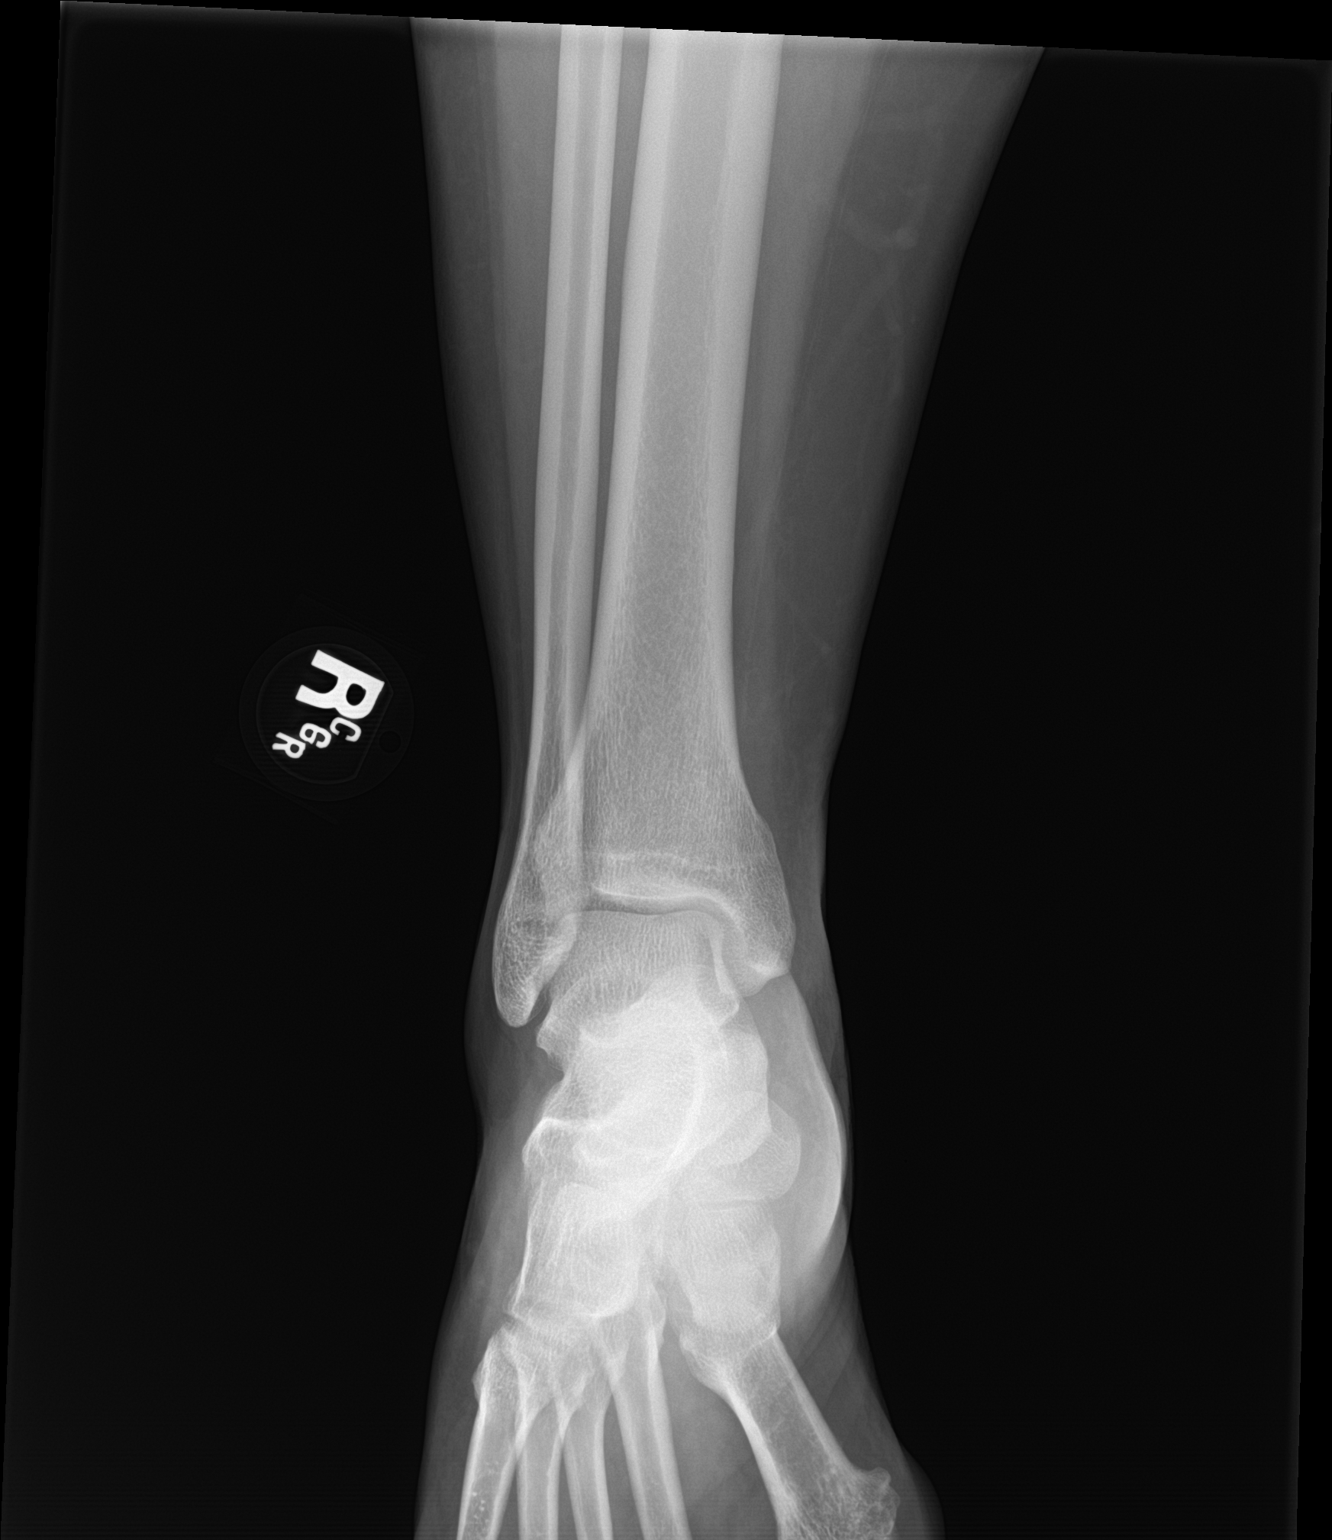

[ankle obl]
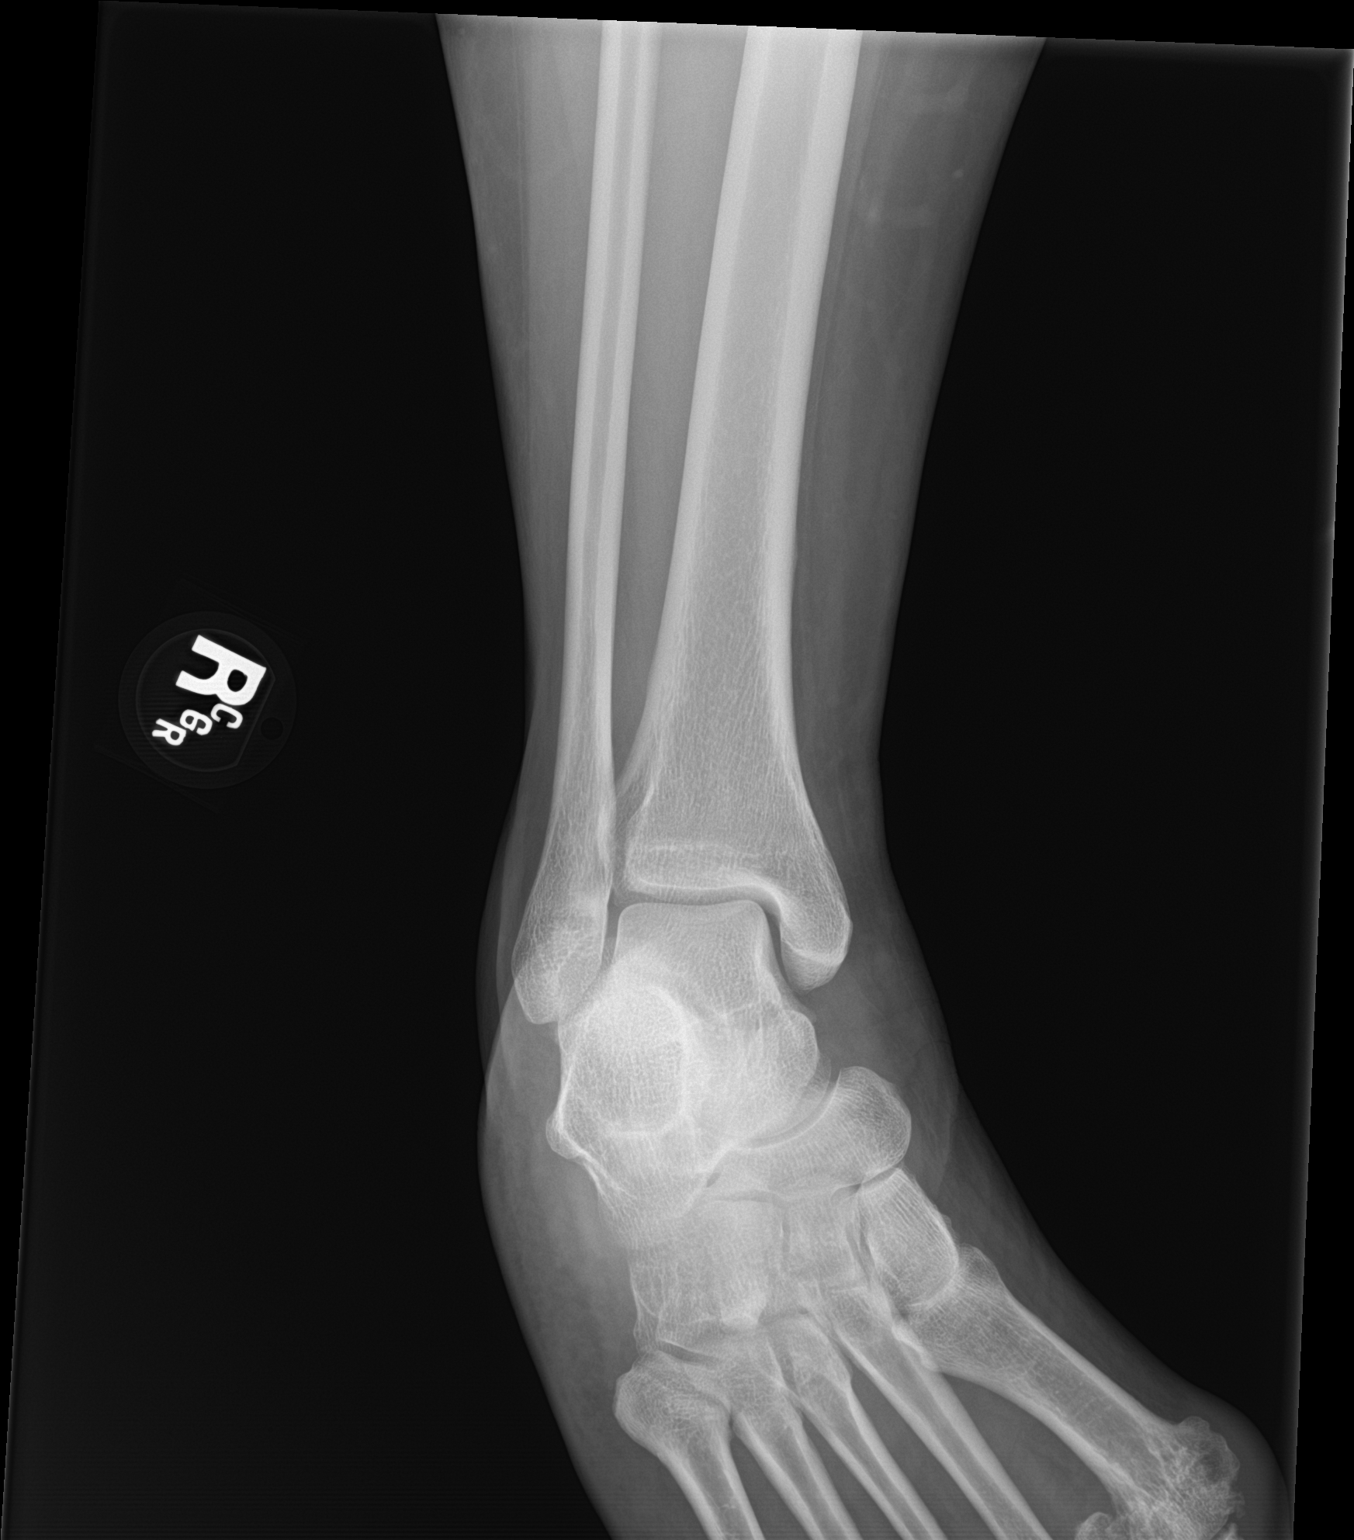

[ankle lat]
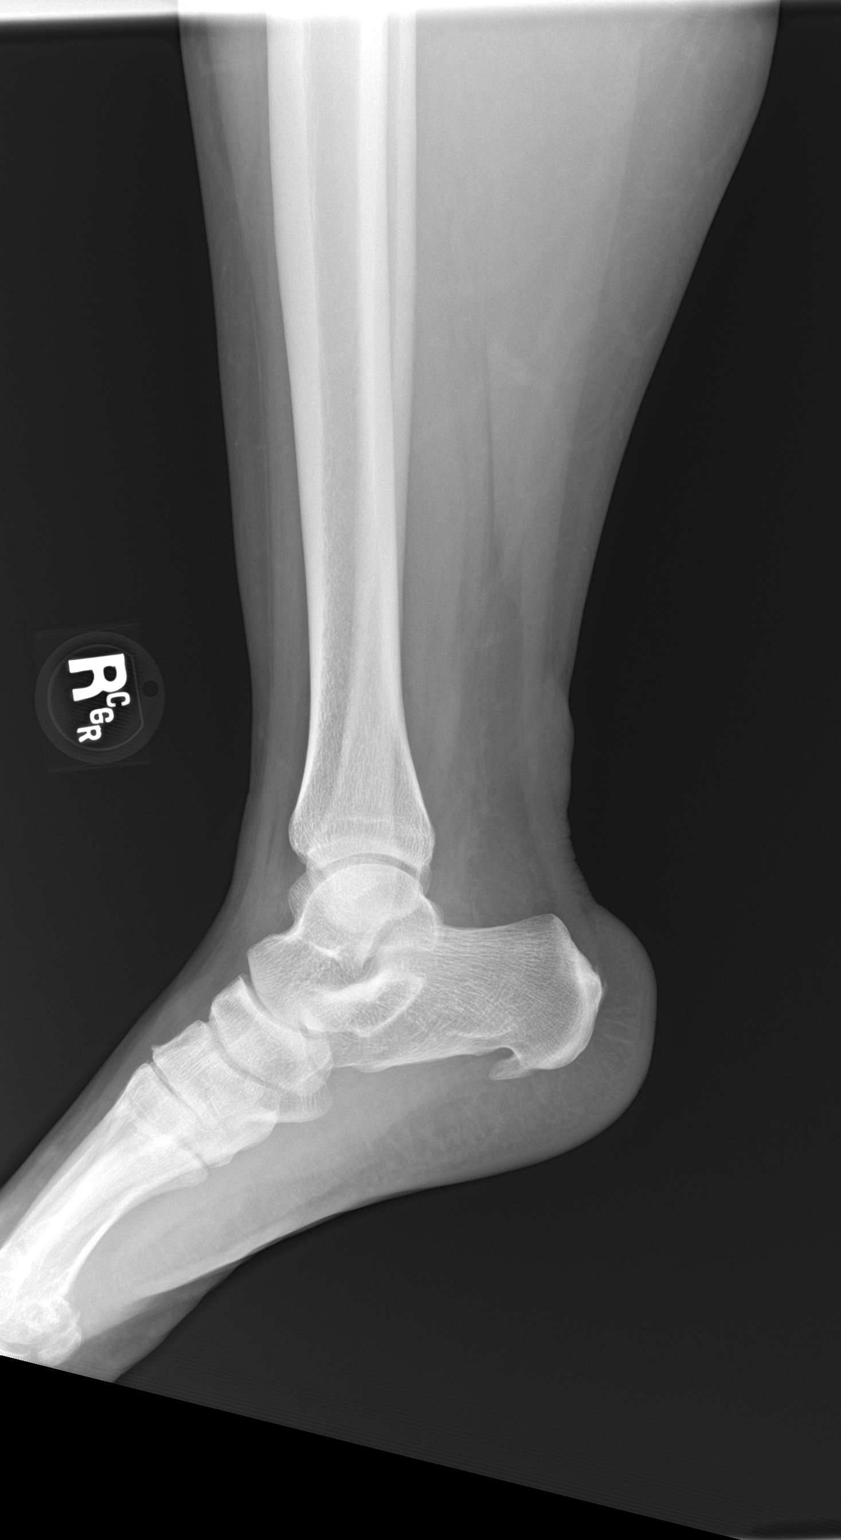

[3 of 3 positions shown; findings below may reference images not displayed]

FINDINGS: Plantar calcaneal spur. Spurring of the first metatarsal head. Mild
dorsal spurring in the midfoot.

Plafond and talar dome appear intact. The malleoli likewise appear
intact. Small Achilles calcaneal spur.
IMPRESSION: 1. Plantar and smaller Achilles calcaneal spurs.
2. Substantial spurring of the first metatarsal head, partially
included on today's exam.
3. No acute bony findings. If pain persists despite conservative
therapy, MRI may be warranted for further characterization.
